# Patient Record
Sex: Female | Born: 1990 | Race: Asian | Hispanic: No | Marital: Single | Smoking: Never smoker
Health system: Southern US, Community
[De-identification: ages and names within clinical notes are randomized; demographics above are authoritative.]

## PROBLEM LIST (undated history)

## (undated) DIAGNOSIS — Z789 Other specified health status: Secondary | ICD-10-CM

---
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
## 2011-02-26 LAB — I-STAT 8, (EC8 V) (CONVERTED LAB)
Acid-base deficit: 19 — ABNORMAL HIGH
BUN: 8
Bicarbonate: 10.5 — ABNORMAL LOW
Chloride: 112
Glucose, Bld: 121 — ABNORMAL HIGH
HCT: 49
Hemoglobin: 16.7 — ABNORMAL HIGH
Operator id: 222501
Potassium: 3.2 — ABNORMAL LOW
Sodium: 145
TCO2: 12
pCO2, Ven: 37 — ABNORMAL LOW
pH, Ven: 7.062 — CL

## 2011-02-26 LAB — CBC
HCT: 43.3
Hemoglobin: 14.1
MCHC: 32.5
MCV: 73.7 — ABNORMAL LOW
Platelets: 430 — ABNORMAL HIGH
RBC: 5.88 — ABNORMAL HIGH
RDW: 13.6
WBC: 19.7 — ABNORMAL HIGH

## 2011-02-26 LAB — COMPREHENSIVE METABOLIC PANEL
ALT: 13
ALT: 14
ALT: 29
ALT: 8
AST: 15
AST: 23
AST: 25
AST: 34
Albumin: 3 — ABNORMAL LOW
Albumin: 3.4 — ABNORMAL LOW
Albumin: 3.7
Albumin: 4.4
Alkaline Phosphatase: 104
Alkaline Phosphatase: 74
Alkaline Phosphatase: 83
Alkaline Phosphatase: 86
BUN: 4 — ABNORMAL LOW
BUN: 5 — ABNORMAL LOW
BUN: 8
BUN: <1 — ABNORMAL LOW
CO2: 15 — ABNORMAL LOW
CO2: 19
CO2: 20
CO2: 24
Calcium: 8.6
Calcium: 8.7
Calcium: 8.8
Calcium: 9.4
Chloride: 109
Chloride: 110
Chloride: 110
Chloride: 111
Creatinine, Ser: 0.49
Creatinine, Ser: 0.54
Creatinine, Ser: 0.55
Creatinine, Ser: 0.77
Glucose, Bld: 103 — ABNORMAL HIGH
Glucose, Bld: 109 — ABNORMAL HIGH
Glucose, Bld: 127 — ABNORMAL HIGH
Glucose, Bld: 92
Potassium: 3 — ABNORMAL LOW
Potassium: 3.5
Potassium: 3.8
Potassium: 3.8
Sodium: 137
Sodium: 138
Sodium: 139
Sodium: 145
Total Bilirubin: 0.7
Total Bilirubin: 0.7
Total Bilirubin: 1
Total Bilirubin: 2.1 — ABNORMAL HIGH
Total Protein: 6
Total Protein: 6.6
Total Protein: 7.1
Total Protein: 8.5 — ABNORMAL HIGH

## 2011-02-26 LAB — SALICYLATE LEVEL: Salicylate Lvl: <4

## 2011-02-26 LAB — POCT I-STAT EG7
Acid-base deficit: 5 — ABNORMAL HIGH
Acid-base deficit: 5 — ABNORMAL HIGH
Bicarbonate: 19.4 — ABNORMAL LOW
Bicarbonate: 19.7 — ABNORMAL LOW
Calcium, Ion: 1.23
Calcium, Ion: 1.26
HCT: 40
HCT: 40
Hemoglobin: 13.6
Hemoglobin: 13.6
O2 Saturation: 76
O2 Saturation: 84
Operator id: 154641
Operator id: 240851
Patient temperature: 36.6
Potassium: 3.7
Potassium: 3.7
Sodium: 141
Sodium: 142
TCO2: 20
TCO2: 21
pCO2, Ven: 34 — ABNORMAL LOW
pCO2, Ven: 34.1 — ABNORMAL LOW
pH, Ven: 7.363 — ABNORMAL HIGH
pH, Ven: 7.37 — ABNORMAL HIGH
pO2, Ven: 42
pO2, Ven: 48 — ABNORMAL HIGH

## 2011-02-26 LAB — BILIRUBIN, FRACTIONATED(TOT/DIR/INDIR)
Bilirubin, Direct: <0.1
Total Bilirubin: 0.9

## 2011-02-26 LAB — URINE MICROSCOPIC-ADD ON

## 2011-02-26 LAB — URINALYSIS, ROUTINE W REFLEX MICROSCOPIC
Bilirubin Urine: NEGATIVE
Glucose, UA: NEGATIVE
Hgb urine dipstick: NEGATIVE
Ketones, ur: NEGATIVE
Leukocytes, UA: NEGATIVE
Nitrite: NEGATIVE
Protein, ur: 30 — AB
Specific Gravity, Urine: 1.022
Urobilinogen, UA: 0.2
pH: 5.5

## 2011-02-26 LAB — DIFFERENTIAL
Basophils Absolute: 0
Basophils Relative: 0
Eosinophils Absolute: 1.2
Eosinophils Relative: 6 — ABNORMAL HIGH
Lymphocytes Relative: 43
Lymphs Abs: 8.5 — ABNORMAL HIGH
Monocytes Absolute: 1.4 — ABNORMAL HIGH
Monocytes Relative: 7
Neutro Abs: 8.6 — ABNORMAL HIGH
Neutrophils Relative %: 44

## 2011-02-26 LAB — LACTIC ACID, PLASMA: Lactic Acid, Venous: 2.1

## 2011-02-26 LAB — POCT I-STAT CREATININE
Creatinine, Ser: 4.8 — ABNORMAL HIGH
Operator id: 222501

## 2011-02-26 LAB — PATHOLOGIST SMEAR REVIEW

## 2011-02-26 LAB — PREGNANCY, URINE: Preg Test, Ur: NEGATIVE

## 2011-02-26 LAB — RAPID URINE DRUG SCREEN, HOSP PERFORMED
Amphetamines: NOT DETECTED
Barbiturates: NOT DETECTED
Benzodiazepines: NOT DETECTED
Cocaine: NOT DETECTED
Opiates: NOT DETECTED
Tetrahydrocannabinol: NOT DETECTED

## 2011-02-26 LAB — ETHANOL: Alcohol, Ethyl (B): <5

## 2011-02-26 LAB — ACETAMINOPHEN LEVEL: Acetaminophen (Tylenol), Serum: <10 — ABNORMAL LOW

## 2016-09-19 ENCOUNTER — Ambulatory Visit (INDEPENDENT_AMBULATORY_CARE_PROVIDER_SITE_OTHER): Payer: BLUE CROSS/BLUE SHIELD

## 2016-09-19 ENCOUNTER — Ambulatory Visit (INDEPENDENT_AMBULATORY_CARE_PROVIDER_SITE_OTHER): Payer: BLUE CROSS/BLUE SHIELD | Admitting: Physician Assistant

## 2016-09-19 VITALS — BP 122/78 | HR 69 | Temp 98.3°F | Resp 18 | Ht 61.0 in | Wt 115.6 lb

## 2016-09-19 DIAGNOSIS — M25471 Effusion, right ankle: Secondary | ICD-10-CM

## 2016-09-19 DIAGNOSIS — M25571 Pain in right ankle and joints of right foot: Secondary | ICD-10-CM | POA: Diagnosis not present

## 2016-09-19 DIAGNOSIS — S93491A Sprain of other ligament of right ankle, initial encounter: Secondary | ICD-10-CM | POA: Diagnosis not present

## 2016-09-19 MED ORDER — MELOXICAM 7.5 MG PO TABS: 7.5000 mg | ORAL_TABLET | Freq: Every day | ORAL | 0 refills | Status: DC

## 2016-09-19 NOTE — Progress Notes (Signed)
   ARIAM MOL  MRN: [id number] DOB: 04-15-1991  PCP: Pcp Not In System  Subjective:  Pt is a 26 year old female who presents to clinic for ankle injury x three weeks. She was walking down steps and her ankle turned inward. Pain is located on the outside of the ankle. Pain does not radiate.  Walking makes it worse. No problems with weight bearing. Endorses swelling - this is her main complaint.  She is not wrapping it or staying off it.  She works as a Advertising account planner.  Denies joint redness, increased warmth, n/t, weakness, instability.   Review of Systems  Musculoskeletal: Positive for arthralgias (ankle), gait problem and joint swelling.  Skin: Negative.   Neurological: Negative for weakness and numbness.    There are no active problems to display for this patient.   No current outpatient prescriptions on file prior to visit.   No current facility-administered medications on file prior to visit.     No Known Allergies   Objective:  BP 122/78   Pulse 69   Temp 98.3 F (36.8 C) (Oral)   Resp 18   Ht  (1.549 m)   Wt 115 lb 9.6 oz (52.4 kg)   LMP 09/19/2016   SpO2 98%   BMI 21.84 kg/m   Physical Exam  Constitutional: She is oriented to person, place, and time and well-developed, well-nourished, and in no distress. No distress.  Cardiovascular: Normal rate, regular rhythm and normal heart sounds.   Musculoskeletal:       Right ankle: She exhibits swelling. She exhibits normal range of motion, no ecchymosis, no deformity, no laceration and normal pulse. Tenderness. Lateral malleolus and AITFL tenderness found. No proximal fibula tenderness found.  Neurological: She is alert and oriented to person, place, and time. GCS score is 15.  Skin: Skin is warm and dry.  Psychiatric: Mood, memory, affect and judgment normal.  Vitals reviewed.  Dg Ankle Complete Right  Result Date: 09/19/2016 CLINICAL DATA:  26 year old female status post twisting injury. Lateral ankle pain  and tenderness to palpation. EXAM: RIGHT ANKLE - COMPLETE 3+ VIEW COMPARISON:  None. FINDINGS: Bone mineralization is within normal limits. Normal mortise joint alignment. Talar dome intact. Positive ankle joint effusion evident on the lateral view. Anterior and mild lateral soft tissue swelling. Distal fibula intact. Calcaneus intact. No acute osseous abnormality identified. IMPRESSION: Right ankle joint effusion but no acute fracture or dislocation identified. Electronically Signed   By: Odessa Fleming M.D.   On: 09/19/2016 09:21    Assessment and Plan :  1. Sprain of anterior talofibular ligament of right ankle, initial encounter 2. Right ankle pain, unspecified chronicity 3. Right ankle swelling - DG Ankle Complete Right; Future - meloxicam (MOBIC) 7.5 MG tablet; Take 1 tablet (7.5 mg total) by mouth daily. Max dose /day  Dispense: 30 tablet; Refill: 0 - Supportive care encouraged. Demonstrated for pt ankle exercises. Advised her to keep ankle wrapped with ace wrap, elevate and ice for swelling. RTC in 2-3 weeks if no improvement. Consider physical therapy or ortho at that time.    Marco Collie, PA-C  Primary Care at [facility] 09/19/2016 8:56 AM

## 2016-09-19 NOTE — Patient Instructions (Addendum)
Thank you for coming in today. I hope you feel we met your needs.  Feel free to call [facility] if you have any questions or further requests.  Please consider signing up for MyChart if you do not already have it, as this is a great way to communicate with me.  Best,  Whitney McVey, PA-C  You have an ankle sprain. It is very important these first few weeks after the injury to immobilize it with brace and perform strengthening exercises.  Elevate your ankle above the heart to decrease swelling. Apply ice for no more than 20 minutes every hour to help with swelling and pain.   Ankle Sprain, Phase I Rehab Ask your health care provider which exercises are safe for you. Do exercises exactly as told by your health care provider and adjust them as directed. It is normal to feel mild stretching, pulling, tightness, or discomfort as you do these exercises, but you should stop right away if you feel sudden pain or your pain gets worse.Do not begin these exercises until told by your health care provider. Stretching and range of motion exercises These exercises warm up your muscles and joints and improve the movement and flexibility of your lower leg and ankle. These exercises also help to relieve pain and stiffness. Exercise A: Gastroc and soleus stretch   1. Sit on the floor with your left / right leg extended. 2. Loop a belt or towel around the ball of your left / right foot. The ball of your foot is on the walking surface, right under your toes. 3. Keep your left / right ankle and foot relaxed and keep your knee straight while you use the belt or towel to pull your foot toward you. You should feel a gentle stretch behind your calf or knee. 4. Hold this position for __________ seconds, then release to the starting position. Repeat the exercise with your knee bent. You can put a pillow or a rolled bath towel under your knee to support it. You should feel a stretch deep in your calf or at your Achilles  tendon. Repeat each stretch __________ times. Complete these stretches __________ times a day. Exercise B: Ankle alphabet   1. Sit with your left / right leg supported at the lower leg.  Do not rest your foot on anything.  Make sure your foot has room to move freely. 2. Think of your left / right foot as a paintbrush, and move your foot to trace each letter of the alphabet in the air. Keep your hip and knee still while you trace. Make the letters as large as you can without feeling discomfort. 3. Trace every letter from A to Z. Repeat __________ times. Complete this exercise __________ times a day. Strengthening exercises These exercises build strength and endurance in your ankle and lower leg. Endurance is the ability to use your muscles for a long time, even after they get tired. Exercise C: Dorsiflexors   1. Secure a rubber exercise band or tube to an object, such as a table leg, that will stay still when the band is pulled. Secure the other end around your left / right foot. 2. Sit on the floor facing the object, with your left / right leg extended. The band or tube should be slightly tense when your foot is relaxed. 3. Slowly bring your foot toward you, pulling the band tighter. 4. Hold this position for __________ seconds. 5. Slowly return your foot to the starting position. Repeat __________  times. Complete this exercise __________ times a day. Exercise D: Plantar flexors   1. Sit on the floor with your left / right leg extended. 2. Loop a rubber exercise tube or band around the ball of your left / right foot. The ball of your foot is on the walking surface, right under your toes.  Hold the ends of the band or tube in your hands.  The band or tube should be slightly tense when your foot is relaxed. 3. Slowly point your foot and toes downward, pushing them away from you. 4. Hold this position for __________ seconds. 5. Slowly return your foot to the starting position. Repeat  __________ times. Complete this exercise __________ times a day. Exercise E: Evertors  1. Sit on the floor with your legs straight out in front of you. 2. Loop a rubber exercise band or tube around the ball of your left / right foot. The ball of your foot is on the walking surface, right under your toes.  Hold the ends of the band in your hands, or secure the band to a stable object.  The band or tube should be slightly tense when your foot is relaxed. 3. Slowly push your foot outward, away from your other leg. 4. Hold this position for __________ seconds. 5. Slowly return your foot to the starting position. Repeat __________ times. Complete this exercise __________ times a day. This information is not intended to replace advice given to you by your health care provider. Make sure you discuss any questions you have with your health care provider. Document Released: 12/20/2004 Document Revised: 01/26/2016 Document Reviewed: 04/04/2015 Elsevier Interactive Patient Education  2017 [employer].   IF you received an x-ray today, you will receive an invoice from [facility]. Please contact [facility] at [phone] with questions or concerns regarding your invoice.   IF you received labwork today, you will receive an invoice from [facility]. Please contact [facility] at [phone] with questions or concerns regarding your invoice.   Our billing staff will not be able to assist you with questions regarding bills from these companies.  You will be contacted with the lab results as soon as they are available. The fastest way to get your results is to activate your My Chart account. Instructions are located on the last page of this paperwork. If you have not heard from [place] regarding the results in 2 weeks, please contact this office.

## 2016-09-22 ENCOUNTER — Emergency Department (HOSPITAL_COMMUNITY): Payer: BLUE CROSS/BLUE SHIELD

## 2016-09-22 ENCOUNTER — Encounter (HOSPITAL_COMMUNITY): Payer: Self-pay | Admitting: *Deleted

## 2016-09-22 ENCOUNTER — Inpatient Hospital Stay (HOSPITAL_COMMUNITY)
Admission: EM | Admit: 2016-09-22 | Discharge: 2016-09-25 | DRG: 603 | Disposition: A | Discharge status: Home / Self Care | Payer: BLUE CROSS/BLUE SHIELD | Attending: Family Medicine | Admitting: Family Medicine

## 2016-09-22 DIAGNOSIS — N179 Acute kidney failure, unspecified: Secondary | ICD-10-CM | POA: Diagnosis not present

## 2016-09-22 DIAGNOSIS — L03211 Cellulitis of face: Principal | ICD-10-CM

## 2016-09-22 DIAGNOSIS — R229 Localized swelling, mass and lump, unspecified: Secondary | ICD-10-CM | POA: Diagnosis not present

## 2016-09-22 DIAGNOSIS — R110 Nausea: Secondary | ICD-10-CM | POA: Diagnosis not present

## 2016-09-22 DIAGNOSIS — L0201 Cutaneous abscess of face: Secondary | ICD-10-CM

## 2016-09-22 DIAGNOSIS — T368X5A Adverse effect of other systemic antibiotics, initial encounter: Secondary | ICD-10-CM | POA: Diagnosis not present

## 2016-09-22 LAB — CBC WITH DIFFERENTIAL/PLATELET
BASOS PCT: 0 %
Basophils Absolute: 0 10*3/uL (ref 0.0–0.1)
Eosinophils Absolute: 0.2 10*3/uL (ref 0.0–0.7)
Eosinophils Relative: 2 %
HEMATOCRIT: 38.1 % (ref 36.0–46.0)
Hemoglobin: 12.6 g/dL (ref 12.0–15.0)
LYMPHS ABS: 2 10*3/uL (ref 0.7–4.0)
Lymphocytes Relative: 17 %
MCH: 25.7 pg — AB (ref 26.0–34.0)
MCHC: 33.1 g/dL (ref 30.0–36.0)
MCV: 77.8 fL — ABNORMAL LOW (ref 78.0–100.0)
MONO ABS: 0.7 10*3/uL (ref 0.1–1.0)
MONOS PCT: 6 %
NEUTROS ABS: 9.2 10*3/uL — AB (ref 1.7–7.7)
Neutrophils Relative %: 75 %
Platelets: 273 10*3/uL (ref 150–400)
RBC: 4.9 MIL/uL (ref 3.87–5.11)
RDW: 13.4 % (ref 11.5–15.5)
WBC: 12.1 10*3/uL — ABNORMAL HIGH (ref 4.0–10.5)

## 2016-09-22 LAB — BASIC METABOLIC PANEL
ANION GAP: 5 (ref 5–15)
BUN: 6 mg/dL (ref 6–20)
CALCIUM: 8.8 mg/dL — AB (ref 8.9–10.3)
CHLORIDE: 106 mmol/L (ref 101–111)
CO2: 26 mmol/L (ref 22–32)
CREATININE: 0.69 mg/dL (ref 0.44–1.00)
GFR calc Af Amer: >60 mL/min (ref >60)
GFR calc non Af Amer: >60 mL/min (ref >60)
GLUCOSE: 84 mg/dL (ref 65–99)
Potassium: 3.8 mmol/L (ref 3.5–5.1)
Sodium: 137 mmol/L (ref 135–145)

## 2016-09-22 LAB — I-STAT BETA HCG BLOOD, ED (MC, WL, AP ONLY)

## 2016-09-22 MED ORDER — SODIUM CHLORIDE 0.9 % IV SOLN
INTRAVENOUS | Status: DC
  Administered 2016-09-22 – 2016-09-24 (×3): via INTRAVENOUS

## 2016-09-22 MED ORDER — MORPHINE SULFATE (PF) 2 MG/ML IV SOLN
2.0000 mg | INTRAVENOUS | Status: DC | PRN
  Administered 2016-09-22 – 2016-09-23 (×2): 2 mg via INTRAVENOUS
  Filled 2016-09-22 (×2): qty 1

## 2016-09-22 MED ORDER — ENOXAPARIN SODIUM 40 MG/0.4ML ~~LOC~~ SOLN
40.0000 mg | SUBCUTANEOUS | Status: DC
Start: 2016-09-22 — End: 2016-09-25
  Administered 2016-09-22 – 2016-09-24 (×3): 40 mg via SUBCUTANEOUS
  Filled 2016-09-22 (×3): qty 0.4

## 2016-09-22 MED ORDER — IOPAMIDOL (ISOVUE-300) INJECTION 61%
INTRAVENOUS | Status: AC
  Administered 2016-09-22: 75 mL
  Filled 2016-09-22: qty 75

## 2016-09-22 MED ORDER — MORPHINE SULFATE (PF) 4 MG/ML IV SOLN
4.0000 mg | Freq: Once | INTRAVENOUS | Status: AC
  Administered 2016-09-22: 4 mg via INTRAVENOUS
  Filled 2016-09-22: qty 1

## 2016-09-22 MED ORDER — VANCOMYCIN HCL IN DEXTROSE 750-5 MG/150ML-% IV SOLN
750.0000 mg | Freq: Two times a day (BID) | INTRAVENOUS | Status: DC
  Administered 2016-09-23 – 2016-09-25 (×5): 750 mg via INTRAVENOUS
  Filled 2016-09-22 (×7): qty 150

## 2016-09-22 MED ORDER — HYDROMORPHONE HCL 1 MG/ML IJ SOLN
0.5000 mg | Freq: Once | INTRAMUSCULAR | Status: AC
Start: 2016-09-22 — End: 2016-09-22
  Administered 2016-09-22: 0.5 mg via INTRAVENOUS
  Filled 2016-09-22: qty 1

## 2016-09-22 MED ORDER — VANCOMYCIN HCL IN DEXTROSE 1-5 GM/200ML-% IV SOLN
1000.0000 mg | Freq: Once | INTRAVENOUS | Status: AC
  Administered 2016-09-22: 1000 mg via INTRAVENOUS
  Filled 2016-09-22: qty 200

## 2016-09-22 NOTE — H&P (Signed)
Family Medicine Teaching [facility] Admission History and Physical Service Pager: [phone]  Patient name: Katelyn Peck Medical record number: [id number] Date of birth: 1990/07/20 Age: 26 y.o. Gender: female  Primary Care Provider: Pcp Not In System Consultants: none Code Status: FULL  Chief Complaint: left facial swelling   Assessment and Plan: Katelyn Peck is a 26 y.o. female presenting with left facial pain, swelling, and redness. No significant PMH.  Left Facial Cellulitis: Vitals stable and afebrile. Mild leukocytosis to 12.1. CT maxillofacial noting cellulitis with a possible small developing abscess in/just above the upper lip but no clearly drainable fluid collection identified. No signs/symptoms of orbital or periorbital cellulitis.  - admit to medsurg, attending Dr. McDiarmid - s/p Vancomycin in ED - Vancomycin per pharmacy - IV Morphine  q 4 PRN for pain - if not improving, consider ENT consult  FEN/GI: full liquid diet, IVF Prophylaxis: Lovenox   Disposition: admit for IV antibiotics   History of Present Illness:  Katelyn Peck is a 26 y.o. female presenting with left facial pain and swelling. Patient reports of having a pimple above the left upper lip starting Monday. The next day it became painful and on the third day the area began to swell and become more painful. Yesterday she tried to pop it in hopes it would relieve some of the pain and little pus may have come out. Her swelling and pain continued to worsen. She went to her PCP today and was given Bactrim; she took 1 tablet and decided to come to the ED due to continued severe pain.   In the ED, vitals stable. Mild leukocytosis. Was started on Vancomycin.   Review Of Systems: Per HPI  Review of Systems  Constitutional: Positive for fever. Negative for chills.  HENT: Negative for congestion, sinus pain and sore throat.   Eyes: Negative for blurred vision, double vision, photophobia, pain, discharge and  redness.  Respiratory: Negative for cough, shortness of breath and stridor.   Cardiovascular: Negative for chest pain and palpitations.  Gastrointestinal: Negative for abdominal pain, constipation, diarrhea, nausea and vomiting.  Genitourinary: Negative for dysuria and frequency.  Neurological: Negative for dizziness, focal weakness and headaches.  Psychiatric/Behavioral: Negative for substance abuse.    Patient Active Problem List   Diagnosis Date Noted  . Cellulitis of face 09/22/2016    Past Medical History: History reviewed. No pertinent past medical history.  Past Surgical History: History reviewed. No pertinent surgical history.  Social History: Social History  Substance Use Topics  . Smoking status: Never Smoker  . Smokeless tobacco: Never Used  . Alcohol use No   No other drug use Please also refer to relevant sections of EMR.  Family History: History reviewed. No pertinent family history.  No significant medical issues   Allergies and Medications: No Known Allergies No current facility-administered medications on file prior to encounter.    Current Outpatient Prescriptions on File Prior to Encounter  Medication Sig Dispense Refill  . meloxicam (MOBIC) 7.5 MG tablet Take 1 tablet (7.5 mg total) by mouth daily. Max dose /day (Patient taking differently: Take 7.5 mg by mouth daily. Max 15 mg/day) 30 tablet 0    Objective: BP 129/80   Pulse 88   Temp 98.2 F (36.8 C) (Oral)   Resp 18   LMP 09/19/2016   SpO2 100%  Exam: General: NAD Eyes: EOMI, PERRL ENTM: unable to fully open mouth to evaluate posterior pharynx. No significant findings with palpation of the left inner  side of the cheek.  Neck: normal ROM, supple no lymphadenopathy Cardiovascular: RRR. No m/r/g Respiratory: CTAB, normal effort, no stridor Gastrointestinal: Soft, nontender, nondistended, NABS, no organomegaly MSK: able to move all extremities equally Derm: note below image,  otherwise unremarkable Neuro: Awake, alert, no focal deficits grossly, normal speech Psych: Mood and affect euthymic, normal rate and volume of speech        Labs and Imaging: CBC BMET   Recent Labs Lab 09/22/16 1513  WBC 12.1*  HGB 12.6  HCT 38.1  PLT 273    Recent Labs Lab 09/22/16 1513  NA 137  K 3.8  CL 106  CO2 26  BUN 6  CREATININE 0.69  GLUCOSE 84  CALCIUM 8.8*    Beta hcg: < 0.5  CT Maxillofacial:  IMPRESSION: Diffuse swelling of the upper lip extending into the left face compatible with cellulitis. Possible small developing abscess in/just above the left upper lip, however no sizable/clearly drainable fluid collection is yet identified.  Palma Holter, MD 09/22/2016, 7:33 PM PGY-2,  Family Medicine FPTS Intern pager: [phone], text pages welcome

## 2016-09-22 NOTE — ED Notes (Signed)
edp notified that the patients swelling is worse on the left side of her face along with pain not under control, edp at bedside assessing patient

## 2016-09-22 NOTE — ED Notes (Signed)
Pt given drink per edp

## 2016-09-22 NOTE — ED Provider Notes (Signed)
[facility]-EMERGENCY DEPT Provider Note   CSN: [id number] Arrival date & time: 09/22/16  1328     History   Chief Complaint Chief Complaint  Patient presents with  . Abscess  . Facial Swelling    HPI Katelyn Peck is a 26 y.o. female.  Pt without significant PMHx, presents w abscess on her L upper lip and facial swelling. On Monday, pt noticed a pimple and popped it. She stated having worsening pain on Thursday and noticed focal swelling on her lip. She popped the pimple once more and the next day had worsening pain w swelling that travelled to the left side of her face. Denies difficulty breathing or swallowing, states pain is achy, 8/10. Reported to urgent care earlier today and was given Bactrim. She has taken 1 dose. Reports to ER because she is not feeling better and is concerned about swelling/pain.      History reviewed. No pertinent past medical history.  There are no active problems to display for this patient.   History reviewed. No pertinent surgical history.  OB History    No data available       Home Medications    Prior to Admission medications   Medication Sig Start Date End Date Taking? Authorizing Provider  meloxicam (MOBIC) 7.5 MG tablet Take 1 tablet (7.5 mg total) by mouth daily. Max dose /day 09/19/16   Madelaine Bhat McVey, PA-C    Family History History reviewed. No pertinent family history.  Social History Social History  Substance Use Topics  . Smoking status: Never Smoker  . Smokeless tobacco: Never Used  . Alcohol use No     Allergies   Patient has no known allergies.   Review of Systems Review of Systems  Constitutional: Positive for chills. Negative for fever.  HENT: Positive for facial swelling. Negative for trouble swallowing.   Skin: Positive for wound (L upper lip).  Allergic/Immunologic: Negative for immunocompromised state.     Physical Exam Updated Vital Signs BP 129/88 (BP Location: Left Arm)   Pulse 73    Temp 98.2 F (36.8 C) (Oral)   Resp 18   LMP 09/19/2016   SpO2 100%   Physical Exam  Constitutional: She appears well-developed and well-nourished.  HENT:  Head: Normocephalic and atraumatic.  Erythematous, draining purulent abscess on face above L upper lip. Upper lip edematous, L face edematous from zygomatic to mandible without extension under mandible or into neck.  Tolerating secretions.  Eyes: Conjunctivae and EOM are normal.  Neck: Normal range of motion. Neck supple.  Cardiovascular: Normal rate, regular rhythm, normal heart sounds and intact distal pulses.  Exam reveals no friction rub.   No murmur heard. Pulmonary/Chest: Effort normal and breath sounds normal. No respiratory distress. She has no wheezes. She has no rales.  Lymphadenopathy:    She has no cervical adenopathy.  Psychiatric: She has a normal mood and affect. Her behavior is normal.  Nursing note and vitals reviewed.    ED Treatments / Results  Labs (all labs ordered are listed, but only abnormal results are displayed) Labs Reviewed  BASIC METABOLIC PANEL - Abnormal; Notable for the following:       Result Value   Calcium 8.8 (*)    All other components within normal limits  CBC WITH DIFFERENTIAL/PLATELET - Abnormal; Notable for the following:    WBC 12.1 (*)    MCV 77.8 (*)    MCH 25.7 (*)    Neutro Abs 9.2 (*)    All  other components within normal limits  I-STAT BETA HCG BLOOD, ED ([facility], [facility], [facility] ONLY)    EKG  EKG Interpretation None       Radiology No results found.  Procedures Procedures (including critical care time)  Medications Ordered in ED Medications  vancomycin (VANCOCIN) IVPB 1000 mg/200 mL premix (1,000 mg Intravenous New Bag/Given 09/22/16 1522)  vancomycin (VANCOCIN) IVPB 750 mg/150 ml premix (not administered)  morphine 4 MG/ML injection 4 mg (4 mg Intravenous Given 09/22/16 1521)  iopamidol (ISOVUE-300) 61 % injection (75 mLs  Contrast Given 09/22/16 1600)     Initial  Impression / Assessment and Plan / ED Course  I have reviewed the triage vital signs and the nursing notes.  Pertinent labs & imaging results that were available during my care of the patient were reviewed by me and considered in my medical decision making (see chart for details).     Pt w abscess to area above L upper lip w associated lip and L sided facial edema. Pt tolerating secretions, not in respiratory distress. Pt afebrile. WBC slightly elevated 12.1. CT maxillofacial pending. IV Vancomycin started in ED. Morphine for pain. Plan to consult hospitalist for possible admission.   Care assumed by Sharilyn Sites, PA-C.  Patient discussed with and seen by Dr. Adriana Simas. Final Clinical Impressions(s) / ED Diagnoses   Final diagnoses:  Facial abscess    New Prescriptions New Prescriptions   No medications on file     [place] N Russo, PA-C 09/22/16 1616    Donnetta Hutching, MD 09/23/16 1024

## 2016-09-22 NOTE — ED Provider Notes (Signed)
Assumed care from PA Russo at shift change.  See her note for full H&P.  Briefly, 26 y.o. F here with apparent facial cellulitis which started out as a small pimple and seems to have worsened from there.  Labs obtained--- leukocytosis noted.  Plan:  CT max/face pending.  Likely will need admission for continued IV abx.  Results for orders placed or performed during the hospital encounter of 09/22/16  Basic metabolic panel  Result Value Ref Range   Sodium 137 135 - 145 mmol/L   Potassium 3.8 3.5 - 5.1 mmol/L   Chloride 106 101 - 111 mmol/L   CO2 26 22 - 32 mmol/L   Glucose, Bld 84 65 - 99 mg/dL   BUN 6 6 - 20 mg/dL   Creatinine, Ser 1.61 0.44 - 1.00 mg/dL   Calcium 8.8 (L) 8.9 - 10.3 mg/dL   GFR calc non Af Amer >60 >60 mL/min   GFR calc Af Amer >60 >60 mL/min   Anion gap 5 5 - 15  CBC with Differential  Result Value Ref Range   WBC 12.1 (H) 4.0 - 10.5 K/uL   RBC 4.90 3.87 - 5.11 MIL/uL   Hemoglobin 12.6 12.0 - 15.0 g/dL   HCT 09.6 04.5 - 40.9 %   MCV 77.8 (L) 78.0 - 100.0 fL   MCH 25.7 (L) 26.0 - 34.0 pg   MCHC 33.1 30.0 - 36.0 g/dL   RDW 81.1 91.4 - 78.2 %   Platelets 273 150 - 400 K/uL   Neutrophils Relative % 75 %   Neutro Abs 9.2 (H) 1.7 - 7.7 K/uL   Lymphocytes Relative 17 %   Lymphs Abs 2.0 0.7 - 4.0 K/uL   Monocytes Relative 6 %   Monocytes Absolute 0.7 0.1 - 1.0 K/uL   Eosinophils Relative 2 %   Eosinophils Absolute 0.2 0.0 - 0.7 K/uL   Basophils Relative 0 %   Basophils Absolute 0.0 0.0 - 0.1 K/uL  I-Stat beta hCG blood, ED  Result Value Ref Range   I-stat hCG, quantitative <5.0 <5 mIU/mL   Comment 3            Ct Maxillofacial W Contrast  Result Date: 09/22/2016 CLINICAL DATA:  Facial swelling.  Left upper lip abscess. EXAM: CT MAXILLOFACIAL WITH CONTRAST TECHNIQUE: Multidetector CT imaging of the maxillofacial structures was performed with intravenous contrast. Multiplanar CT image reconstructions were also generated. A small metallic BB was placed on the  right temple in order to reliably differentiate right from left. CONTRAST:  75mL ISOVUE-300 IOPAMIDOL (ISOVUE-300) INJECTION 61% COMPARISON:  None. FINDINGS: Osseous: No evidence of fracture or destructive osseous process. No significant dental disease identified. Temporomandibular joints are located. Orbits: Intact globes.  No inflammatory changes. Sinuses: Paranasal sinuses and mastoid air cells are clear. There is slight rightward nasal septal deviation. Soft tissues: There is moderate diffuse soft tissue swelling involving the upper lip. To the left of midline at this level is a 2.1 x 0.4 cm region of slightly lower density just below the skin surface which may reflect a small developing abscess, however a well organized, sizeable, clearly drainable fluid collection is not identified. Soft tissue swelling extends into the subcutaneous tissues of the left face. Limited intracranial: Unremarkable. IMPRESSION: Diffuse swelling of the upper lip extending into the left face compatible with cellulitis. Possible small developing abscess in/just above the left upper lip, however no sizable/clearly drainable fluid collection is yet identified. Electronically Signed   By: Jolaine Click.D.  On: 09/22/2016 16:39    5:13 PM Patient reassessed-- states pain did not improve after morphine.  Will give dose of dilaudid here.  CT scan consistent with facial cellulitis--- questionable developing abscess, however no drainable fluid collection at this time.  Will admit for continued IV abx.     Garlon Hatchet, PA-C 09/22/16 2321    Shaune Pollack, MD 09/23/16 [phone]

## 2016-09-22 NOTE — Progress Notes (Signed)
Pharmacy Antibiotic Note  Katelyn Peck is a 26 y.o. female admitted on 09/22/2016 with cellulitis.  Pharmacy has been consulted for vancomycin dosing. Pt is afebrile but WBC is elevated at 12.1. SCr is WNL.   Plan: Vancomycin 1gm IV x 1 then  IV Q12H F/u renal fxn, C&S, clinical status and trough at SS     Temp (24hrs), Avg:98.2 F (36.8 C), Min:98.2 F (36.8 C), Max:98.2 F (36.8 C)   Recent Labs Lab 09/22/16 1513  WBC 12.1*  CREATININE 0.69    Estimated Creatinine Clearance: 81.1 mL/min (by C-G formula based on SCr of 0.69 mg/dL).    No Known Allergies  Antimicrobials this admission: Vanc 4/21>>  Dose adjustments this admission: N/A  Microbiology results: Pending  Thank you for allowing pharmacy to be a part of this patient's care.  Hakeen Shipes, Drake Leach 09/22/2016 4:14 PM

## 2016-09-22 NOTE — ED Triage Notes (Signed)
Pt has bump/abscess to left upper lip x 1 week that is getting worse. Was seen at [facility] today and started on antibiotics but feels like swelling is increasing to her face and lip, pt reports possible fever.

## 2016-09-22 NOTE — ED Notes (Signed)
Patient transported to CT 

## 2016-09-23 DIAGNOSIS — R229 Localized swelling, mass and lump, unspecified: Secondary | ICD-10-CM | POA: Diagnosis present

## 2016-09-23 DIAGNOSIS — N179 Acute kidney failure, unspecified: Secondary | ICD-10-CM | POA: Diagnosis not present

## 2016-09-23 DIAGNOSIS — T368X5A Adverse effect of other systemic antibiotics, initial encounter: Secondary | ICD-10-CM | POA: Diagnosis not present

## 2016-09-23 DIAGNOSIS — L03211 Cellulitis of face: Secondary | ICD-10-CM | POA: Diagnosis present

## 2016-09-23 DIAGNOSIS — R110 Nausea: Secondary | ICD-10-CM | POA: Diagnosis not present

## 2016-09-23 LAB — CBC
HCT: 39.2 % (ref 36.0–46.0)
HEMOGLOBIN: 12.8 g/dL (ref 12.0–15.0)
MCH: 25.4 pg — ABNORMAL LOW (ref 26.0–34.0)
MCHC: 32.7 g/dL (ref 30.0–36.0)
MCV: 77.8 fL — ABNORMAL LOW (ref 78.0–100.0)
Platelets: 271 10*3/uL (ref 150–400)
RBC: 5.04 MIL/uL (ref 3.87–5.11)
RDW: 13.2 % (ref 11.5–15.5)
WBC: 13.1 10*3/uL — ABNORMAL HIGH (ref 4.0–10.5)

## 2016-09-23 LAB — BASIC METABOLIC PANEL
ANION GAP: 9 (ref 5–15)
BUN: <5 mg/dL — ABNORMAL LOW (ref 6–20)
CALCIUM: 8.7 mg/dL — AB (ref 8.9–10.3)
CHLORIDE: 103 mmol/L (ref 101–111)
CO2: 25 mmol/L (ref 22–32)
CREATININE: 0.82 mg/dL (ref 0.44–1.00)
GFR calc non Af Amer: >60 mL/min (ref >60)
Glucose, Bld: 81 mg/dL (ref 65–99)
Potassium: 3.8 mmol/L (ref 3.5–5.1)
Sodium: 137 mmol/L (ref 135–145)

## 2016-09-23 LAB — HIV ANTIBODY (ROUTINE TESTING W REFLEX): HIV SCREEN 4TH GENERATION: NONREACTIVE

## 2016-09-23 MED ORDER — HYDROMORPHONE HCL 1 MG/ML IJ SOLN
0.5000 mg | Freq: Four times a day (QID) | INTRAMUSCULAR | Status: DC | PRN
  Administered 2016-09-23 (×2): 0.5 mg via INTRAVENOUS
  Filled 2016-09-23 (×2): qty 1

## 2016-09-23 MED ORDER — PIPERACILLIN-TAZOBACTAM 3.375 G IVPB 30 MIN
3.3750 g | INTRAVENOUS | Status: AC
  Administered 2016-09-23: 3.375 g via INTRAVENOUS
  Filled 2016-09-23: qty 50

## 2016-09-23 MED ORDER — HYDROMORPHONE HCL 1 MG/ML IJ SOLN
0.5000 mg | INTRAMUSCULAR | Status: DC | PRN
  Administered 2016-09-23 – 2016-09-24 (×4): 0.5 mg via INTRAVENOUS
  Filled 2016-09-23 (×4): qty 1

## 2016-09-23 MED ORDER — PIPERACILLIN-TAZOBACTAM 3.375 G IVPB
3.3750 g | Freq: Three times a day (TID) | INTRAVENOUS | Status: DC
  Administered 2016-09-23 – 2016-09-24 (×3): 3.375 g via INTRAVENOUS
  Filled 2016-09-23 (×4): qty 50

## 2016-09-23 NOTE — Progress Notes (Addendum)
FPTS Interim Progress Note  S: Went to evaluate patient. Her pain is better controlled with dilaudid. Reports that the swelling has worsened especially around the left jaw area which is apparent to me as well. The swelling around the angle of the mandible is more pronounced compared to exam on admission.  She is not been sleeping on her left side. Denies difficulty swallowing or difficulty breathing.   Due to worsening swelling despite starting Vancomycin, I think it is worth broadening coverage. Will add Zosyn for now. Consider ENT consult this AM. Will discuss with AM team.   Palma Holter, MD 09/23/2016, 5:43 AM PGY-2, [facility] Family Medicine Service pager [phone]

## 2016-09-23 NOTE — Progress Notes (Signed)
Pharmacy Antibiotic Note  Katelyn Peck is a 26 y.o. female admitted on 09/22/2016 with cellulitis.  Pharmacy originally consulted for vancomycin dosing (Day #2). Now adding Zosyn as pt continues with facial swelling.    Plan: Vancomycin  IV Q12H Zosyn 3.375gm IV now over 30 min then 3.375gm IV q8h - subsequent doses over 4 hours F/u renal fxn, C&S, clinical status and trough at SS  Height:  (154.9 cm) Weight: 118 lb 4.8 oz (53.7 kg) IBW/kg (Calculated) : 47.8  Temp (24hrs), Avg:99.3 F (37.4 C), Min:98.2 F (36.8 C), Max:100.2 F (37.9 C)   Recent Labs Lab 09/22/16 1513  WBC 12.1*  CREATININE 0.69    Estimated Creatinine Clearance: 81.1 mL/min (by C-G formula based on SCr of 0.69 mg/dL).    No Known Allergies  Antimicrobials this admission: Vanc 4/21>> Zosyn 4/22 >>  Dose adjustments this admission: N/A  Microbiology results: Pending  Thank you for allowing pharmacy to be a part of this patient's care.  Christoper Fabian, PharmD, BCPS Clinical pharmacist, pager [phone] 09/23/2016 5:53 AM

## 2016-09-23 NOTE — Progress Notes (Signed)
Patient continues to complain of increasing left side facial pain that is not relieved by the prn morphine IV.  The swelling to the side of the face is worsening.  MD made aware.  Morphine IV dc'd and pain medication changed to IV Dilaudid.  0.5 mg IV Dilaudid given at 0237.  Will continue to monitor patient.  [employer] RN-BC, WTA.

## 2016-09-23 NOTE — Progress Notes (Signed)
New Admission Note:   Arrival Method:   Via stretcher from the ED Mental Orientation:  A & O x 4 Telemetry: N/A Assessment: Completed Skin:  Cellulitis to left lip and left side of face IV:  RAFA PIV Pain: c/o left sided facial pain Tubes:  None Safety Measures: Safety Fall Prevention Plan has been given, discussed and signed Admission: Completed [address] [place] Orientation: Patient has been orientated to the room, unit and staff.  Family:  Boyfriend is at bedside  Patient has pierced ears and pierced belly button.  Cell phone is at bedside.  Advised that [facility] not responsible for valuables.  Discussed with patient and boyfriend that we would consider her a moderate fall risk due to the IVF and the IV pain medications.  Advised that if family is not in the room that she should call for assistance to the bathroom before getting out of bed.  Both the patient and her boyfriend understand and are in agreement with the above.  Orders have been reviewed and implemented. Will continue to monitor the patient. Call light has been placed within reach and bed alarm has been activated.   Bernie Covey RN- [place], [place] Phone number: [phone]

## 2016-09-23 NOTE — Progress Notes (Signed)
Family Medicine Teaching Service Daily Progress Note Intern Pager: [phone]  Patient name: Katelyn Peck Medical record number: [id number] Date of birth: 02/04/1991 Age: 26 y.o. Gender: female  Primary Care Provider: Pcp Not In System Consultants: None Code Status: Full  Assessment and Plan: 26 y.o. female presenting with left facial pain, swelling, and redness. No significant PMH.  Left Facial Cellulitis: Mild leukocytosis to 13.1. CT maxillofacial noting cellulitis with a possible small developing abscess in/just above the upper lip but no clearly drainable fluid collection identified.  - Full liquid diet secondary to difficulty opening mouth and chewing - Vancomycin (4/21>>) and Zosyn (4/22>>) - IV Dilaudid 0.5mg  q4hr PRN for pain - Ice packs PRN - If not improving over next 24hr, consider ENT consult. - NS@75cc /hr  FEN/GI: full liquid diet, IVF Prophylaxis: Lovenox   Disposition: admit for IV antibiotics   Subjective:  Reports swelling and pain are similar to last night. Requests ice packs for face. No other acute concerns.  Objective: Temp:  [98.2 F (36.8 C)-100.2 F (37.9 C)] 99.6 F (37.6 C) (04/22 0249) Pulse Rate:  [61-90] 77 (04/22 0249) Resp:  [18] 18 (04/21 2035) BP: (112-133)/(74-96) 121/76 (04/22 0249) SpO2:  [97 %-100 %] 98 % (04/22 0249) Weight:  [118 lb 4.8 oz (53.7 kg)] 118 lb 4.8 oz (53.7 kg) (04/21 2035) Physical Exam: General: 26yo female resting comfortably HEENT: Swelling to left jaw noted with increased tenderness, no areas of fluctuance noted. Please see pictures from H&P. Cardiovascular: S1 and S2 noted, no murmurs, regular rate  Respiratory: Clear to auscultation bilaterally, no wheezes, no increased work of breathing Abdomen: Soft and nondistended, nontender Extremities: No edema noted  Laboratory:  Recent Labs Lab 09/22/16 1513 09/23/16 0531  WBC 12.1* 13.1*  HGB 12.6 12.8  HCT 38.1 39.2  PLT 273 271    Recent Labs Lab  09/22/16 1513 09/23/16 0531  NA 137 137  K 3.8 3.8  CL 106 103  CO2 26 25  BUN 6 <5*  CREATININE 0.69 0.82  CALCIUM 8.8* 8.7*  GLUCOSE 84 81   Imaging/Diagnostic Tests: Dg Ankle Complete Right  Result Date: 09/19/2016 CLINICAL DATA:  26 year old female status post twisting injury. Lateral ankle pain and tenderness to palpation. EXAM: RIGHT ANKLE - COMPLETE 3+ VIEW COMPARISON:  None. FINDINGS: Bone mineralization is within normal limits. Normal mortise joint alignment. Talar dome intact. Positive ankle joint effusion evident on the lateral view. Anterior and mild lateral soft tissue swelling. Distal fibula intact. Calcaneus intact. No acute osseous abnormality identified. IMPRESSION: Right ankle joint effusion but no acute fracture or dislocation identified. Electronically Signed   By: Odessa Fleming M.D.   On: 09/19/2016 09:21   Ct Maxillofacial W Contrast  Result Date: 09/22/2016 CLINICAL DATA:  Facial swelling.  Left upper lip abscess. EXAM: CT MAXILLOFACIAL WITH CONTRAST TECHNIQUE: Multidetector CT imaging of the maxillofacial structures was performed with intravenous contrast. Multiplanar CT image reconstructions were also generated. A small metallic BB was placed on the right temple in order to reliably differentiate right from left. CONTRAST:  75mL ISOVUE-300 IOPAMIDOL (ISOVUE-300) INJECTION 61% COMPARISON:  None. FINDINGS: Osseous: No evidence of fracture or destructive osseous process. No significant dental disease identified. Temporomandibular joints are located. Orbits: Intact globes.  No inflammatory changes. Sinuses: Paranasal sinuses and mastoid air cells are clear. There is slight rightward nasal septal deviation. Soft tissues: There is moderate diffuse soft tissue swelling involving the upper lip. To the left of midline at this level is a 2.1 x  0.4 cm region of slightly lower density just below the skin surface which may reflect a small developing abscess, however a well organized,  sizeable, clearly drainable fluid collection is not identified. Soft tissue swelling extends into the subcutaneous tissues of the left face. Limited intracranial: Unremarkable. IMPRESSION: Diffuse swelling of the upper lip extending into the left face compatible with cellulitis. Possible small developing abscess in/just above the left upper lip, however no sizable/clearly drainable fluid collection is yet identified. Electronically Signed   By: Sebastian Ache M.D.   On: 09/22/2016 16:39   Araceli Bouche, DO 09/23/2016, 9:13 AM PGY-3, [place] [facility] FPTS Intern pager: [phone], text pages welcome

## 2016-09-24 LAB — CBC
HCT: 36.5 % (ref 36.0–46.0)
HEMOGLOBIN: 12 g/dL (ref 12.0–15.0)
MCH: 25.6 pg — ABNORMAL LOW (ref 26.0–34.0)
MCHC: 32.9 g/dL (ref 30.0–36.0)
MCV: 77.8 fL — ABNORMAL LOW (ref 78.0–100.0)
Platelets: 269 10*3/uL (ref 150–400)
RBC: 4.69 MIL/uL (ref 3.87–5.11)
RDW: 13 % (ref 11.5–15.5)
WBC: 10 10*3/uL (ref 4.0–10.5)

## 2016-09-24 MED ORDER — ACETAMINOPHEN 325 MG PO TABS
650.0000 mg | ORAL_TABLET | Freq: Four times a day (QID) | ORAL | Status: DC | PRN
  Administered 2016-09-24 (×2): 650 mg via ORAL
  Filled 2016-09-24 (×2): qty 2

## 2016-09-24 MED ORDER — ONDANSETRON HCL 4 MG/2ML IJ SOLN
4.0000 mg | Freq: Four times a day (QID) | INTRAMUSCULAR | Status: DC | PRN
  Administered 2016-09-24 (×2): 4 mg via INTRAVENOUS
  Filled 2016-09-24 (×2): qty 2

## 2016-09-24 NOTE — Progress Notes (Signed)
Family Medicine Teaching Service Daily Progress Note Intern Pager: [phone]  Patient name: Katelyn Peck Medical record number: [id number] Date of birth: 05-Dec-1990 Age: 26 y.o. Gender: female  Primary Care Provider: Pcp Not In System Consultants: None Code Status: Full  Assessment and Plan: 26 y.o. female presenting with left facial pain, swelling, and redness. No significant PMH.  Left Facial Cellulitis, improving: Improving leukocytosis 12.1>13.1>10. CT maxillofacial noting cellulitis with a possible small developing abscess in/just above the upper lip but no clearly drainable fluid collection identified.  - Full liquid diet secondary to difficulty opening mouth and chewing - Vancomycin (4/21>>), likely transition to PO abx 4/24 - discontinue Zosyn - Tylenol PRN pain - ENT if not improving - warm compresses   Nausea: -  Zofran 4 mg q6h PRN  FEN/GI: full liquid diet > advance diet as, IVF Prophylaxis: Lovenox   Disposition: admit for IV antibiotics   Subjective:  Reports swelling and pain are improved. Was able to eat breakfast this AM. No other acute concerns.  Objective: Temp:  [97.5 F (36.4 C)-98.4 F (36.9 C)] 97.8 F (36.6 C) (04/23 0825) Pulse Rate:  [75-79] 79 (04/23 0825) Resp:  [18] 18 (04/23 0825) BP: (114-125)/(63-89) 114/63 (04/23 0825) SpO2:  [100 %] 100 % (04/23 0825) Weight:  [54.5 kg (120 lb 3.2 oz)] 54.5 kg (120 lb 3.2 oz) (04/22 2158) Physical Exam: General: 26yo female resting comfortably HEENT: Swelling to left jaw noted with increased tenderness, no areas of fluctuance noted. Please see pictures from H&P. Cardiovascular: S1 and S2 noted, no murmurs, regular rate  Respiratory: Clear to auscultation bilaterally, no wheezes, no increased work of breathing Abdomen: Soft and nondistended, nontender Extremities: No edema noted  Laboratory:  Recent Labs Lab 09/22/16 1513 09/23/16 0531 09/24/16 0737  WBC 12.1* 13.1* 10.0  HGB 12.6 12.8 12.0   HCT 38.1 39.2 36.5  PLT 273 271 269    Recent Labs Lab 09/22/16 1513 09/23/16 0531  NA 137 137  K 3.8 3.8  CL 106 103  CO2 26 25  BUN 6 <5*  CREATININE 0.69 0.82  CALCIUM 8.8* 8.7*  GLUCOSE 84 81   Imaging/Diagnostic Tests: No results found.   Su Grand, Medical Student 09/24/2016, 8:55 AM  Assessment and Plan: Katelyn Peck with left facial pain, swelling, and redness. No significant PMH.  Left Facial Cellulitis, improving: Leukocytosis resolved this AM. Vitals stable.  - advance diet as tolerated - Vancomycin (4/21>>) with plan to transition to PO 4/24 - discontinue Zosyn - Tylenol PRN pain - warm compress  - consider ENT consult if worsens  FEN/GI: full liquid diet > ADAT,  Prophylaxis: Lovenox   Dispo: likely DC 4/24  Physical Exam Gen: NAD Pulm: CTAB CV: RRR, no m/r/g Abd: soft, NT, ND, + bowel sounds Skin:      MSK: moves all extremities Neuro: awake, alert   Riesa Pope, MD Family Medicine, PGY 2 09/24/2016 8:51 AM

## 2016-09-24 NOTE — Progress Notes (Signed)
Patient is complaining of nausea. MD notified. Verbal order to give Zofran  IV q6h PRN. Orders placed. Will continue to monitor.

## 2016-09-25 LAB — CBC
HEMATOCRIT: 37.8 % (ref 36.0–46.0)
HEMOGLOBIN: 12.6 g/dL (ref 12.0–15.0)
MCH: 25.6 pg — ABNORMAL LOW (ref 26.0–34.0)
MCHC: 33.3 g/dL (ref 30.0–36.0)
MCV: 76.8 fL — ABNORMAL LOW (ref 78.0–100.0)
Platelets: 279 10*3/uL (ref 150–400)
RBC: 4.92 MIL/uL (ref 3.87–5.11)
RDW: 12.9 % (ref 11.5–15.5)
WBC: 9.9 10*3/uL (ref 4.0–10.5)

## 2016-09-25 LAB — BASIC METABOLIC PANEL
ANION GAP: 8 (ref 5–15)
BUN: 6 mg/dL (ref 6–20)
CALCIUM: 9.3 mg/dL (ref 8.9–10.3)
CHLORIDE: 104 mmol/L (ref 101–111)
CO2: 27 mmol/L (ref 22–32)
Creatinine, Ser: 1.38 mg/dL — ABNORMAL HIGH (ref 0.44–1.00)
GFR calc non Af Amer: 53 mL/min — ABNORMAL LOW (ref >60)
Glucose, Bld: 91 mg/dL (ref 65–99)
Potassium: 3.9 mmol/L (ref 3.5–5.1)
SODIUM: 139 mmol/L (ref 135–145)

## 2016-09-25 MED ORDER — DOXYCYCLINE HYCLATE 100 MG PO TABS
100.0000 mg | ORAL_TABLET | Freq: Two times a day (BID) | ORAL | Status: DC
  Administered 2016-09-25: 100 mg via ORAL
  Filled 2016-09-25: qty 1

## 2016-09-25 MED ORDER — DOXYCYCLINE HYCLATE 100 MG PO TABS: 100.0000 mg | ORAL_TABLET | Freq: Two times a day (BID) | ORAL | 0 refills | Status: AC

## 2016-09-25 MED ORDER — ONDANSETRON HCL 4 MG PO TABS: 4.0000 mg | ORAL_TABLET | Freq: Three times a day (TID) | ORAL | 0 refills | Status: DC | PRN

## 2016-09-25 MED ORDER — ACETAMINOPHEN 325 MG PO TABS: 650.0000 mg | ORAL_TABLET | Freq: Four times a day (QID) | ORAL | 0 refills | Status: DC | PRN

## 2016-09-25 NOTE — Discharge Instructions (Signed)
You were admitted for skin infection of the face (cellulitis). You were initially started on IV antibiotics then transitioned to oral antibiotic. Please take as directed. Your kidney marker bumped up slightly which was likely due to the IV antibiotics you were on. You have a hospital follow up visit at the family medicine clinic; they will check your kidney function to make sure the kidney marker is improving. Therefore, it is important to keep your appointment. You can also establish care at the family medicine clinic if you would like.   You can take Tylenol as needed for pain. Please do not take Ibuprofen, Meloxicam, or similar medications for pain until you kidney marker returns to normal. You can take Zofran as needed for nausea.   If you do not want to establish care at the [facility]: For the names and phone numbers of local doctors accepting patients you may call [facility] [phone] however since you have insurance, you may call the number on your insurance card for the names of doctors who accept your insurance.     Cellulitis, Adult Cellulitis is a skin infection. The infected area is usually red and sore. This condition occurs most often in the arms and lower legs. It is very important to get treated for this condition. Follow these instructions at home:  Take over-the-counter and prescription medicines only as told by your doctor.  If you were prescribed an antibiotic medicine, take it as told by your doctor. Do not stop taking the antibiotic even if you start to feel better.  Drink enough fluid to keep your pee (urine) clear or pale yellow.  Do not touch or rub the infected area.  Raise (elevate) the infected area above the level of your heart while you are sitting or lying down.  Place warm or cold wet cloths (warm or cold compresses) on the infected area. Do this as told by your doctor.  Keep all follow-up visits as told by your doctor. This is important.  These visits let your doctor make sure your infection is not getting worse. Contact a doctor if:  You have a fever.  Your symptoms do not get better after 1-2 days of treatment.  Your bone or joint under the infected area starts to hurt after the skin has healed.  Your infection comes back. This can happen in the same area or another area.  You have a swollen bump in the infected area.  You have new symptoms.  You feel ill and also have muscle aches and pains. Get help right away if:  Your symptoms get worse.  You feel very sleepy.  You throw up (vomit) or have watery poop (diarrhea) for a long time.  There are red streaks coming from the infected area.  Your red area gets larger.  Your red area turns darker. This information is not intended to replace advice given to you by your health care provider. Make sure you discuss any questions you have with your health care provider. Document Released: 11/07/2007 Document Revised: 10/27/2015 Document Reviewed: 03/30/2015 Elsevier Interactive Patient Education  2017 [place].  Doxycycline tablets or capsules What is this medicine? DOXYCYCLINE (dox i SYE kleen) is a tetracycline antibiotic. It kills certain bacteria or stops their growth. It is used to treat many kinds of infections, like dental, skin, respiratory, and urinary tract infections. It also treats acne, Lyme disease, malaria, and certain sexually transmitted infections. This medicine may be used for other purposes; ask your health care provider  or pharmacist if you have questions. COMMON BRAND NAME(S): Acticlate, Adoxa, Adoxa CK, Adoxa Pak, Adoxa TT, Alodox, Avidoxy, Doxal, Mondoxyne NL, Monodox, Morgidox 1x, Morgidox 1x Kit, Morgidox 2x, Morgidox 2x Kit, NutriDox, Ocudox, TARGADOX, Vibra-Tabs, Vibramycin What should I tell my health care provider before I take this medicine? They need to know if you have any of these conditions: -liver disease -long exposure to  sunlight like working outdoors -stomach problems like colitis -an unusual or allergic reaction to doxycycline, tetracycline antibiotics, other medicines, foods, dyes, or preservatives -pregnant or trying to get pregnant -breast-feeding How should I use this medicine? Take this medicine by mouth with a full glass of water. Follow the directions on the prescription label. It is best to take this medicine without food, but if it upsets your stomach take it with food. Take your medicine at regular intervals. Do not take your medicine more often than directed. Take all of your medicine as directed even if you think you are better. Do not skip doses or stop your medicine early. Talk to your pediatrician regarding the use of this medicine in children. While this drug may be prescribed for selected conditions, precautions do apply. Overdosage: If you think you have taken too much of this medicine contact a poison control center or emergency room at once. NOTE: This medicine is only for you. Do not share this medicine with others. What if I miss a dose? If you miss a dose, take it as soon as you can. If it is almost time for your next dose, take only that dose. Do not take double or extra doses. What may interact with this medicine? -antacids -barbiturates -birth control pills -bismuth subsalicylate -carbamazepine -methoxyflurane -other antibiotics -phenytoin -vitamins that contain iron -warfarin This list may not describe all possible interactions. Give your health care provider a list of all the medicines, herbs, non-prescription drugs, or dietary supplements you use. Also tell them if you smoke, drink alcohol, or use illegal drugs. Some items may interact with your medicine. What should I watch for while using this medicine? Tell your doctor or health care professional if your symptoms do not improve. Do not treat diarrhea with over the counter products. Contact your doctor if you have diarrhea  that lasts more than 2 days or if it is severe and watery. Do not take this medicine just before going to bed. It may not dissolve properly when you lay down and can cause pain in your throat. Drink plenty of fluids while taking this medicine to also help reduce irritation in your throat. This medicine can make you more sensitive to the sun. Keep out of the sun. If you cannot avoid being in the sun, wear protective clothing and use sunscreen. Do not use sun lamps or tanning beds/booths. Birth control pills may not work properly while you are taking this medicine. Talk to your doctor about using an extra method of birth control. If you are being treated for a sexually transmitted infection, avoid sexual contact until you have finished your treatment. Your sexual partner may also need treatment. Avoid antacids, aluminum, calcium, magnesium, and iron products for 4 hours before and 2 hours after taking a dose of this medicine. If you are using this medicine to prevent malaria, you should still protect yourself from contact with mosquitos. Stay in screened-in areas, use mosquito nets, keep your body covered, and use an insect repellent. What side effects may I notice from receiving this medicine? Side effects that you should report  to your doctor or health care professional as soon as possible: -allergic reactions like skin rash, itching or hives, swelling of the face, lips, or tongue -difficulty breathing -fever -itching in the rectal or genital area -pain on swallowing -redness, blistering, peeling or loosening of the skin, including inside the mouth -severe stomach pain or cramps -unusual bleeding or bruising -unusually weak or tired -yellowing of the eyes or skin Side effects that usually do not require medical attention (report to your doctor or health care professional if they continue or are bothersome): -diarrhea -loss of appetite -nausea, vomiting This list may not describe all possible  side effects. Call your doctor for medical advice about side effects. You may report side effects to FDA at 1-800-FDA-1088. Where should I keep my medicine? Keep out of the reach of children. Store at room temperature, below 30 degrees C (86 degrees F). Protect from light. Keep container tightly closed. Throw away any unused medicine after the expiration date. Taking this medicine after the expiration date can make you seriously ill. NOTE: This sheet is a summary. It may not cover all possible information. If you have questions about this medicine, talk to your doctor, pharmacist, or health care provider.  2018 Elsevier/Gold Standard (2015-06-22 17:11:22)  Ondansetron tablets What is this medicine? ONDANSETRON (on DAN se tron) is used to treat nausea and vomiting caused by chemotherapy. It is also used to prevent or treat nausea and vomiting after surgery. This medicine may be used for other purposes; ask your health care provider or pharmacist if you have questions. COMMON BRAND NAME(S): Zofran What should I tell my health care provider before I take this medicine? They need to know if you have any of these conditions: -heart disease -history of irregular heartbeat -liver disease -low levels of magnesium or potassium in the blood -an unusual or allergic reaction to ondansetron, granisetron, other medicines, foods, dyes, or preservatives -pregnant or trying to get pregnant -breast-feeding How should I use this medicine? Take this medicine by mouth with a glass of water. Follow the directions on your prescription label. Take your doses at regular intervals. Do not take your medicine more often than directed. Talk to your pediatrician regarding the use of this medicine in children. Special care may be needed. Overdosage: If you think you have taken too much of this medicine contact a poison control center or emergency room at once. NOTE: This medicine is only for you. Do not share this  medicine with others. What if I miss a dose? If you miss a dose, take it as soon as you can. If it is almost time for your next dose, take only that dose. Do not take double or extra doses. What may interact with this medicine? Do not take this medicine with any of the following medications: -apomorphine -certain medicines for fungal infections like fluconazole, itraconazole, ketoconazole, posaconazole, voriconazole -cisapride -dofetilide -dronedarone -pimozide -thioridazine -ziprasidone This medicine may also interact with the following medications: -carbamazepine -certain medicines for depression, anxiety, or psychotic disturbances -fentanyl -linezolid -MAOIs like Carbex, Eldepryl, Marplan, Nardil, and Parnate -methylene blue (injected into a vein) -other medicines that prolong the QT interval (cause an abnormal heart rhythm) -phenytoin -rifampicin -tramadol This list may not describe all possible interactions. Give your health care provider a list of all the medicines, herbs, non-prescription drugs, or dietary supplements you use. Also tell them if you smoke, drink alcohol, or use illegal drugs. Some items may interact with your medicine. What should I watch for while  using this medicine? Check with your doctor or health care professional right away if you have any sign of an allergic reaction. What side effects may I notice from receiving this medicine? Side effects that you should report to your doctor or health care professional as soon as possible: -allergic reactions like skin rash, itching or hives, swelling of the face, lips or tongue -breathing problems -confusion -dizziness -fast or irregular heartbeat -feeling faint or lightheaded, falls -fever and chills -loss of balance or coordination -seizures -sweating -swelling of the hands or feet -tightness in the chest -tremors -unusually weak or tired Side effects that usually do not require medical attention (report  to your doctor or health care professional if they continue or are bothersome): -constipation or diarrhea -headache This list may not describe all possible side effects. Call your doctor for medical advice about side effects. You may report side effects to FDA at 1-800-FDA-1088. Where should I keep my medicine? Keep out of the reach of children. Store between 2 and 30 degrees C (36 and 86 degrees F). Throw away any unused medicine after the expiration date. NOTE: This sheet is a summary. It may not cover all possible information. If you have questions about this medicine, talk to your doctor, pharmacist, or health care provider.  2018 Elsevier/Gold Standard (2013-02-25 16:27:45)

## 2016-09-25 NOTE — Progress Notes (Signed)
Family Medicine Teaching Service Daily Progress Note Intern Pager: [phone]  Patient name: Katelyn Peck Medical record number: [id number] Date of birth: Oct 23, 1990 Age: 26 y.o. Gender: female  Primary Care Provider: Pcp Not In System Consultants: None Code Status: Full  Assessment and Plan: 26 y.o. female presenting with left facial pain, swelling, and redness. No significant PMH.  Left Facial Cellulitis, improving: Improving leukocytosis 12.1>13.1>10>9.9. s/p tx with Vancomycin (4/21-4/24) and Zosyn (4/22-4/23) - Advanced diet as tolerated due to improvement in opening mouth and chewing  - Transition to PO Doxycycline 100 mg BID x 5 days   - Tylenol PRN pain - warm compresses for lip and ice for jaw    Nausea: -  Zofran 4 mg q6h PRN  FEN/GI: Advance diet as tolerated Prophylaxis: Lovenox   Disposition: Discharge home today. No PCP. CSW to provide list of physicians.   Subjective:  Reports swelling and pain are improved. Was able to eat dinner last night. Endorses nausea but no episodes of emesis. Nausea improved with zofran.  Tylenol improves pain. No other acute concerns.  Objective: Temp:  [97.4 F (36.3 C)-98 F (36.7 C)] 98 F (36.7 C) (04/24 0907) Pulse Rate:  [56-76] 61 (04/24 0907) Resp:  [18] 18 (04/24 0907) BP: (106-114)/(64-84) 106/64 (04/24 0907) SpO2:  [99 %-100 %] 99 % (04/24 0907) Weight:  [56.1 kg (123 lb 10.9 oz)] 56.1 kg (123 lb 10.9 oz) (04/23 2053) Physical Exam: General: 26yo female resting comfortably HEENT: Swelling to left jaw noted with increased tenderness, no areas of fluctuance noted. Please see pictures from H&P and progress notes. Cardiovascular: S1 and S2 noted, no murmurs, regular rate  Respiratory: Clear to auscultation bilaterally, no wheezes, no increased work of breathing Abdomen: Soft and nondistended, nontender  Laboratory:  Recent Labs Lab 09/23/16 0531 09/24/16 0737 09/25/16 0304  WBC 13.1* 10.0 9.9  HGB 12.8 12.0 12.6    HCT 39.2 36.5 37.8  PLT 271 269 279    Recent Labs Lab 09/22/16 1513 09/23/16 0531 09/25/16 0304  NA 137 137 139  K 3.8 3.8 3.9  CL 106 103 104  CO2 BUN 6 <5* 6  CREATININE 0.69 0.82 1.38*  CALCIUM 8.8* 8.7* 9.3  GLUCOSE 84 81 91   Imaging/Diagnostic Tests: No results found.   Katelyn Peck, Medical Student 09/25/2016, 10:15 AM  ________________________________________________________________________ I agree with the medical student's note above, with notable exceptions in the assessment and plan which I have given below.   Assessment and Plan: 26 y.o.femalepresenting with left facial pain, swelling, and redness. No significant PMH.  Left Facial Cellulitis, improving: Leukocytosis resolved this AM. Vitals stable.  - advance diet as tolerated - Vancomycin (4/21>>) > transition to PO Doxycycline  BID today for 5 more days - Tylenol PRN pain - warm compress  - social work to provide list of providers (patient is not followed by PCP)  FEN/GI: regular diet Prophylaxis: Lovenox   Dispo: likely DC 4/24  Physical Exam Gen: NAD Pulm: CTAB CV: RRR, no m/r/g Abd: soft, NT, ND, + bowel sounds Skin:        Riesa Pope, MD Family Medicine, PGY 2 09/25/2016 9:51 AM

## 2016-09-25 NOTE — Progress Notes (Signed)
Discharge instructions, RX's and follow up appts explained and provided to patient verbalized understanding. ° °Durene Dodge Lynn, RN ° °

## 2016-09-25 NOTE — Care Management Note (Signed)
Case Management Note  Patient Details  Name: KAYELYN LEMON MRN: [id number] Date of Birth: 01-Feb-1991  Subjective/Objective:         CM following for progression and d/c planning.            Action/Plan: 09/25/2016 Met with pt and family member, explained to pt that since she has commercial insurance she should contact her insurance company re local PCP, also provided pt with phone number for [facility]. She may be able to obtain the names and numbers of local MDs who are accepting new patients. She would need to inquire re if the MD accepts her insurance.  This was explained to the pt who verbalized understanding.   Expected Discharge Date:  09/25/2016               Expected Discharge Plan:  Home/Self Care  In-House Referral:  NA  Discharge planning Services  CM Consult  Post Acute Care Choice:  NA Choice offered to:  NA  DME Arranged:  N/A DME Agency:  NA  HH Arranged:  NA HH Agency:  NA  Status of Service:  Completed, signed off  If discussed at [place] of Stay Meetings, dates discussed:    Additional Comments:  Adron Bene, RN 09/25/2016, 10:31 AM

## 2016-09-25 NOTE — Discharge Summary (Signed)
Family Medicine Teaching [facility] Discharge Summary  Patient name: Katelyn Peck Medical record number: [id number] Date of birth: Jun 22, 1990 Age: 26 y.o. Gender: female Date of Admission: 09/22/2016  Date of Discharge: 09/25/16 Admitting Physician: Leighton Roach McDiarmid, MD  Primary Care Provider: Pcp Not In System Consultants: None  Indication for Hospitalization: Left facial cellulitis requiring IV antibiotics   Discharge Diagnoses/Problem List:  Left facial cellulitis  Nausea AKI  Disposition: Home  Discharge Condition: Stable  Discharge Exam:  General: 26yo female resting comfortably HEENT: Swelling to left jaw noted with minimal tenderness and no areas of fluctuance noted. Please see pictures from H&P and progress notes. Cardiovascular: S1 and S2 noted, no murmurs, regular rate  Respiratory: Clear to auscultation bilaterally, no wheezes, no increased work of breathing Abdomen: Soft and nondistended, non-tender, normoactive bowel sounds         Brief Hospital Course:  Katelyn Peck is a 26 y.o. female presenting with left facial pain, swelling, and redness. No significant PMH  Left Facial Cellulitis, improving: On admission, her vitals were stable and she had a mild leukocytosis to 12.1. CT maxillofacial noting cellulitis with a possible small developing abscess in/just above the upper lip but no clearly drainable fluid collection identified. No signs/symptoms of orbital or periorbital cellulitis. She was started on vancomycin and morphine PRN for pain. Her pain was not improved with the morphine so she was switched to IV Dilaudid with better pain control.  She was transitioned to PO Doxycycline after she was able to tolerate PO intake. On discharge, her vitals were stable, leukocytosis was resolved and her left facial cellulitis was stable and much improved from admission. Patient was discharged with 5 days of Doxycycline to complete total 7 days of antibiotics.   Nausea 2/2  decreased PO intake and IV abx, improving: During her stay she was nauseous without emesis. She was given Zofran PRN with resolution of her nausea. Initially she had decreased PO intake because of her facial cellulitis which improved by time of discharge. Over the course of her stay she only required two doses of Zofran PRN.   New onset AKI, most likely 2/2 vancomycin: On hospital day 3, her AM BMP showed a Cr 1.38. Baseline was 0.8. She was able to tolerate PO intake and had adequate urine output throughout her stay.   Issues for Follow Up:  1. Left facial cellulitis - follow up on improvement/resolution of cellulitis 2. AKI - Get repeat Cr. Follow up on improvement/resolution of AKI   Significant Procedures: None  Significant Labs and Imaging:  Results for orders placed or performed during the hospital encounter of 09/22/16  Basic metabolic panel  Result Value Ref Range   Sodium 137 135 - 145 mmol/L   Potassium 3.8 3.5 - 5.1 mmol/L   Chloride 106 101 - 111 mmol/L   CO2 26 22 - 32 mmol/L   Glucose, Bld 84 65 - 99 mg/dL   BUN 6 6 - 20 mg/dL   Creatinine, Ser 4.09 0.44 - 1.00 mg/dL   Calcium 8.8 (L) 8.9 - 10.3 mg/dL   GFR calc non Af Amer >60 >60 mL/min   GFR calc Af Amer >60 >60 mL/min   Anion gap 5 5 - 15  CBC with Differential  Result Value Ref Range   WBC 12.1 (H) 4.0 - 10.5 K/uL   RBC 4.90 3.87 - 5.11 MIL/uL   Hemoglobin 12.6 12.0 - 15.0 g/dL   HCT 81.1 91.4 - 78.2 %  MCV 77.8 (L) 78.0 - 100.0 fL   MCH 25.7 (L) 26.0 - 34.0 pg   MCHC 33.1 30.0 - 36.0 g/dL   RDW 25.3 66.4 - 40.3 %   Platelets 273 150 - 400 K/uL   Neutrophils Relative % 75 %   Neutro Abs 9.2 (H) 1.7 - 7.7 K/uL   Lymphocytes Relative 17 %   Lymphs Abs 2.0 0.7 - 4.0 K/uL   Monocytes Relative 6 %   Monocytes Absolute 0.7 0.1 - 1.0 K/uL   Eosinophils Relative 2 %   Eosinophils Absolute 0.2 0.0 - 0.7 K/uL   Basophils Relative 0 %   Basophils Absolute 0.0 0.0 - 0.1 K/uL  HIV antibody (Routine Testing)   Result Value Ref Range   HIV Screen 4th Generation wRfx Non Reactive Non Reactive  CBC  Result Value Ref Range   WBC 13.1 (H) 4.0 - 10.5 K/uL   RBC 5.04 3.87 - 5.11 MIL/uL   Hemoglobin 12.8 12.0 - 15.0 g/dL   HCT 47.4 25.9 - 56.3 %   MCV 77.8 (L) 78.0 - 100.0 fL   MCH 25.4 (L) 26.0 - 34.0 pg   MCHC 32.7 30.0 - 36.0 g/dL   RDW 87.5 64.3 - 32.9 %   Platelets 271 150 - 400 K/uL  Basic metabolic panel  Result Value Ref Range   Sodium 137 135 - 145 mmol/L   Potassium 3.8 3.5 - 5.1 mmol/L   Chloride 103 101 - 111 mmol/L   CO2 25 22 - 32 mmol/L   Glucose, Bld 81 65 - 99 mg/dL   BUN <5 (L) 6 - 20 mg/dL   Creatinine, Ser 5.18 0.44 - 1.00 mg/dL   Calcium 8.7 (L) 8.9 - 10.3 mg/dL   GFR calc non Af Amer >60 >60 mL/min   GFR calc Af Amer >60 >60 mL/min   Anion gap 9 5 - 15  CBC  Result Value Ref Range   WBC 10.0 4.0 - 10.5 K/uL   RBC 4.69 3.87 - 5.11 MIL/uL   Hemoglobin 12.0 12.0 - 15.0 g/dL   HCT 84.1 66.0 - 63.0 %   MCV 77.8 (L) 78.0 - 100.0 fL   MCH 25.6 (L) 26.0 - 34.0 pg   MCHC 32.9 30.0 - 36.0 g/dL   RDW 16.0 10.9 - 32.3 %   Platelets 269 150 - 400 K/uL  CBC  Result Value Ref Range   WBC 9.9 4.0 - 10.5 K/uL   RBC 4.92 3.87 - 5.11 MIL/uL   Hemoglobin 12.6 12.0 - 15.0 g/dL   HCT 55.7 32.2 - 02.5 %   MCV 76.8 (L) 78.0 - 100.0 fL   MCH 25.6 (L) 26.0 - 34.0 pg   MCHC 33.3 30.0 - 36.0 g/dL   RDW 42.7 06.2 - 37.6 %   Platelets 279 150 - 400 K/uL  Basic metabolic panel  Result Value Ref Range   Sodium 139 135 - 145 mmol/L   Potassium 3.9 3.5 - 5.1 mmol/L   Chloride 104 101 - 111 mmol/L   CO2 27 22 - 32 mmol/L   Glucose, Bld 91 65 - 99 mg/dL   BUN 6 6 - 20 mg/dL   Creatinine, Ser 2.83 (H) 0.44 - 1.00 mg/dL   Calcium 9.3 8.9 - 15.1 mg/dL   GFR calc non Af Amer 53 (L) >60 mL/min   GFR calc Af Amer >60 >60 mL/min   Anion gap 8 5 - 15  I-Stat beta hCG blood, ED  Result Value  Ref Range   I-stat hCG, quantitative <5.0 <5 mIU/mL   Comment 3            Results/Tests  Pending at Time of Discharge: None  Discharge Medications:  Allergies as of 09/25/2016   No Known Allergies     Medication List    STOP taking these medications   ibuprofen 200 MG tablet Commonly known as:  ADVIL,MOTRIN   meloxicam 7.5 MG tablet Commonly known as:  MOBIC   sulfamethoxazole-trimethoprim 800-160 MG tablet Commonly known as:  BACTRIM DS,SEPTRA DS     TAKE these medications   acetaminophen 325 MG tablet Commonly known as:  TYLENOL Take 2 tablets (650 mg total) by mouth every 6 (six) hours as needed.   diphenhydrAMINE 25 MG tablet Commonly known as:  BENADRYL Take 25 mg by mouth once.   doxycycline 100 MG tablet Commonly known as:  VIBRA-TABS Take 1 tablet (100 mg total) by mouth every 12 (twelve) hours.   ondansetron 4 MG tablet Commonly known as:  ZOFRAN Take 1 tablet (4 mg total) by mouth every 8 (eight) hours as needed for nausea or vomiting.       Discharge Instructions: Please refer to Patient Instructions section of EMR for full details.  Patient was counseled important signs and symptoms that should prompt return to medical care, changes in medications, dietary instructions, activity restrictions, and follow up appointments.   Follow-Up Appointments: Follow-up Information    Freddrick March, MD Follow up on 09/28/2016.   Why:  on 9:30AM for hospital follow up visit.  Contact information: [address] [place] [address] [phone]

## 2016-09-28 ENCOUNTER — Encounter: Payer: Self-pay | Admitting: Family Medicine

## 2016-09-28 ENCOUNTER — Ambulatory Visit (INDEPENDENT_AMBULATORY_CARE_PROVIDER_SITE_OTHER): Payer: BLUE CROSS/BLUE SHIELD | Admitting: Family Medicine

## 2016-09-28 VITALS — BP 114/66 | HR 75 | Temp 97.8°F | Ht 61.0 in | Wt 117.0 lb

## 2016-09-28 DIAGNOSIS — N179 Acute kidney failure, unspecified: Secondary | ICD-10-CM

## 2016-09-28 DIAGNOSIS — L03211 Cellulitis of face: Secondary | ICD-10-CM

## 2016-09-28 NOTE — Patient Instructions (Addendum)
It was really nice meeting you today! You were seen in clinic for hospital follow up after being discharged for facial cellulitis.  I'm glad your infection is improving and looking a lot better today.  You can continue to take the antibiotic prescribed to you (Doxycycline) to complete the full course.   Take Tylenol as needed for the pain as you have been doing.    Additionally, I checked your kidney function today as we had discussed since it was a little elevated during your hospitalization.  I will follow up with you if this is still a concern.    You can make an appointment to be seen in clinic if you feel as though the infection is worsening or if you are having fevers.   Be well,  Freddrick March, MD

## 2016-09-28 NOTE — Progress Notes (Signed)
   Subjective:   Patient ID: Katelyn Peck    DOB: [birth year], 26 y.o. female   MRN: [id number]  CC: hospital follow-up   HPI: Katelyn Peck is a 26 y.o. female who presents to clinic today for follow up facial cellulitis.   Left facial cellulitis Patient reports her infection is improving and almost completely cleared.  Discharged with 5 days of doxycycline to complete total 7 days of antibiotics.   Is taking abx as prescribed and taking Tylenol as needed for the pain.  Area no longer hurts her and she has not been febrile.  Felt a little warm at home but no fevers.   ROS: Denies fevers, chills, nausea, vomiting, diarrhea.    ROS: See HPI for pertinent ROS.  PMFSH: Pertinent past medical, surgical, family, and social history were reviewed and updated as appropriate. Smoking status reviewed.  Medications reviewed.  Objective:   BP 114/66   Pulse 75   Temp 97.8 F (36.6 C) (Oral)   Ht  (1.549 m)   Wt 117 lb (53.1 kg)   LMP 09/19/2016 (Exact Date)   SpO2 99%   BMI 22.11 kg/m  Vitals and nursing note reviewed.  General: 26 yo F, NAD  HEENT: NCAT, MMM, o/p clear , minimal erythema over L upper lip, no warmth or swelling noticed, no tenderness to palpation Neck: supple CV: RRR no MRG  Lungs: CTA b/l with normal work of breathing  Skin: warm, dry     Assessment & Plan:   AKI (acute kidney injury) (HCC) Elevated Cr to 1.38 during hospitalization (BL 0.5-0.6)  -Likely 2/2 decreased po intake and Vancomycin  -BMET today to check -Advised hydration    Cellulitis of face Much improved from previous photograph.  Patient has remained afebrile at home and tolerating antibiotics well.  -Advised continue Doxycycline for total 7 day course -Tylenol as needed for pain relief -May use a scar cream to reduce appearance of affected area -Return precautions discussed  Orders Placed This Encounter  Procedures  . Basic metabolic panel    Freddrick March, MD [facility]  [facility], PGY-1 10/02/2016 6:34 PM

## 2016-09-29 LAB — BASIC METABOLIC PANEL
BUN / CREAT RATIO: 15 (ref 9–23)
BUN: 17 mg/dL (ref 6–20)
CHLORIDE: 100 mmol/L (ref 96–106)
CO2: 22 mmol/L (ref 18–29)
Calcium: 9.4 mg/dL (ref 8.7–10.2)
Creatinine, Ser: 1.12 mg/dL — ABNORMAL HIGH (ref 0.57–1.00)
GFR calc non Af Amer: 68 mL/min/{1.73_m2} (ref >59)
GFR, EST AFRICAN AMERICAN: 79 mL/min/{1.73_m2} (ref >59)
Glucose: 81 mg/dL (ref 65–99)
POTASSIUM: 4.3 mmol/L (ref 3.5–5.2)
SODIUM: 138 mmol/L (ref 134–144)

## 2016-10-02 DIAGNOSIS — N179 Acute kidney failure, unspecified: Secondary | ICD-10-CM | POA: Insufficient documentation

## 2016-10-02 NOTE — Assessment & Plan Note (Addendum)
Elevated Cr to 1.38 during hospitalization (BL 0.5-0.6)  -Likely 2/2 decreased po intake and Vancomycin  -BMET today to check -Advised hydration

## 2016-10-02 NOTE — Assessment & Plan Note (Addendum)
Much improved from previous photograph.  Patient has remained afebrile at home and tolerating antibiotics well.  -Advised continue Doxycycline for total 7 day course -Tylenol as needed for pain relief -May use a scar cream to reduce appearance of affected area -Return precautions discussed

## 2016-11-14 ENCOUNTER — Encounter: Payer: Self-pay | Admitting: Family Medicine

## 2016-11-14 ENCOUNTER — Ambulatory Visit (INDEPENDENT_AMBULATORY_CARE_PROVIDER_SITE_OTHER): Payer: BLUE CROSS/BLUE SHIELD | Admitting: Family Medicine

## 2016-11-14 VITALS — BP 118/62 | HR 66 | Temp 98.2°F | Ht 61.0 in | Wt 127.4 lb

## 2016-11-14 DIAGNOSIS — L03211 Cellulitis of face: Secondary | ICD-10-CM | POA: Diagnosis not present

## 2016-11-14 MED ORDER — DOXYCYCLINE HYCLATE 100 MG PO TABS: 100.0000 mg | ORAL_TABLET | Freq: Two times a day (BID) | ORAL | 0 refills | Status: DC

## 2016-11-14 NOTE — Patient Instructions (Signed)
We will give you a shot of Ceftriaxone today and start another course of Doxycycline. Please return tomorrow. Please go to the ED if worsening occurs overnight.  Dr. Caroleen Hammanumley

## 2016-11-15 ENCOUNTER — Encounter: Payer: Self-pay | Admitting: Family Medicine

## 2016-11-15 ENCOUNTER — Ambulatory Visit (INDEPENDENT_AMBULATORY_CARE_PROVIDER_SITE_OTHER): Payer: BLUE CROSS/BLUE SHIELD | Admitting: Family Medicine

## 2016-11-15 DIAGNOSIS — L03211 Cellulitis of face: Secondary | ICD-10-CM | POA: Diagnosis not present

## 2016-11-15 MED ORDER — CEFTRIAXONE SODIUM 1 G IJ SOLR
1.0000 g | Freq: Once | INTRAMUSCULAR | Status: AC
  Administered 2016-11-14: 1 g via INTRAMUSCULAR

## 2016-11-15 NOTE — Progress Notes (Signed)
Subjective:     Patient ID: Katelyn Peck, female   DOB: 03/01/1991, 26 y.o.   MRN: [id number]  HPI [name] is a 26yo female presenting today for facial cellulitis. Recent history of hospitalization from 4/21-4/24 for facial cellulitis, initially treated with vancomycin and then transitioned to doxycycline. Completed course of Doxycycline. Reports yesterday a new lesion appeared right in the middle of her upper lip. Was painful yesterday, but seems to be swelling even more today. States this is exactly how her last episode of facial cellulitis started. Denies fever. Denies drainage. Denies rashes elsewhere. Has a residual area of hyperpigmentation for previous lesion. Nonsmoker.  Review of Systems Per HPI    Objective:   Physical Exam  Constitutional: She appears well-developed and well-nourished. No distress.  Cardiovascular: Normal rate and regular rhythm.   No murmur heard. Pulmonary/Chest: Effort normal. No respiratory distress. She has no wheezes.  Skin:  362mmx2mm pustule with slight surrounding erythema noted with 1cmx1cm area of induration underneath. Please see picture below.  Psychiatric: She has a normal mood and affect. Her behavior is normal.  Tearful       Assessment and Plan:     1. Facial cellulitis Recommend more aggressive treatment given recent hospitalization for same. Refuses CBC, but consider if no improvement. Dose of Ceftriaxone given. Initiated on Doxycycline to complete 10 day course. Offered to unroof after precepting with Dr. Jennette KettleNeal, however patient wishes to try antibiotics and then consider further intervention at next office visit. Recommend close follow up on 6/14. To go to ED if progresses significantly prior to next visit.

## 2016-11-15 NOTE — Progress Notes (Signed)
   HPI  CC: Cellulitis/folliculitis Patient is here for follow-up with her cellulitis/folliculitis. She states that she feels well this morning. Slight improvement from yesterday. She had not picked up the doxycycline that had been called in yesterday. She states that she did not know of this medication. Tenderness is improved and she feels well overall. She denies any fevers, chills, headache, blurred vision, nausea, vomiting, diarrhea, weakness, numbness, or fatigue.  Review of Systems See HPI for ROS.   CC, SH/smoking status, and VS noted  Objective: BP 104/60 (BP Location: Left Arm, Patient Position: Sitting, Cuff Size: Normal)   Pulse 85   Temp 98.2 F (36.8 C) (Oral)   Wt 127 lb (57.6 kg)   BMI 24.00 kg/m  Gen: NAD, alert, cooperative. CV: Well-perfused. Resp: Non-labored. Neuro: Sensation intact throughout. Integument: 402mmx2mm pustule with slight surrounding erythema noted with slight region of surrounding induration. No surrounding erythema. No involvement of the mucosal surface within the mouth.   Assessment and plan:  Cellulitis of face Patient is here for a follow-up on her facial infection. Slight improvement reported by patient compared to yesterday. Per the picture from yesterday I would agree. Patient has not picked up doxycycline due to some communication error. No red flag symptoms at this time. - Encouraged picking up doxycycline. 1 tablet twice a day 10 days - Discussed proper facial skin hygiene. Non-scented facial soap to be used once daily. Encouraged use of facemask while at work (employed at [employer]). - Follow-up when necessary   Kathee DeltonIan D McKeag, MD,MS,  PGY3 11/15/2016 12:38 PM

## 2016-11-15 NOTE — Addendum Note (Signed)
Addended by: Georges LynchSAUNDERS, Artez Regis T on: 11/15/2016 09:40 AM   Modules accepted: Orders

## 2016-11-15 NOTE — Assessment & Plan Note (Signed)
Patient is here for a follow-up on her facial infection. Slight improvement reported by patient compared to yesterday. Per the picture from yesterday I would agree. Patient has not picked up doxycycline due to some communication error. No red flag symptoms at this time. - Encouraged picking up doxycycline. 1 tablet twice a day 10 days - Discussed proper facial skin hygiene. Non-scented facial soap to be used once daily. Encouraged use of facemask while at work (employed at [employer]). - Follow-up when necessary

## 2016-11-15 NOTE — Patient Instructions (Signed)
It was a pleasure seeing you today in our clinic. Today we discussed your skin infection. Here is the treatment plan we have discussed and agreed upon together:   - Begin taking the doxycycline tablets. Take 1 tablet twice a day for the next 10 days. - I would start using a facial soap once a day. This does not need to be an expensive soap and may actually be more appropriate fit was a non-scented type. Lather, apply, and rinse off every night before bed. - Come back for reevaluation in 1-2 weeks if your symptoms do not improve.

## 2017-03-14 ENCOUNTER — Encounter (HOSPITAL_COMMUNITY): Payer: Self-pay | Admitting: Family Medicine

## 2017-03-14 ENCOUNTER — Ambulatory Visit (HOSPITAL_COMMUNITY)
Admission: EM | Admit: 2017-03-14 | Discharge: 2017-03-14 | Disposition: A | Discharge status: Home / Self Care | Payer: BLUE CROSS/BLUE SHIELD | Attending: Family Medicine | Admitting: Family Medicine

## 2017-03-14 DIAGNOSIS — H6012 Cellulitis of left external ear: Secondary | ICD-10-CM | POA: Diagnosis not present

## 2017-03-14 LAB — POCT PREGNANCY, URINE: PREG TEST UR: NEGATIVE

## 2017-03-14 MED ORDER — NEOMYCIN-POLYMYXIN-HC 3.5-10000-1 OT SUSP: 4.0000 [drp] | Freq: Three times a day (TID) | OTIC | 0 refills | Status: DC

## 2017-03-14 MED ORDER — DOXYCYCLINE HYCLATE 100 MG PO TABS: 100.0000 mg | ORAL_TABLET | Freq: Two times a day (BID) | ORAL | 0 refills | Status: DC

## 2017-03-14 NOTE — ED Notes (Signed)
Dr. Milus Glazier sees PT prior to triage

## 2017-03-14 NOTE — ED Provider Notes (Signed)
  [facility]   [id number] 03/14/17 Arrival Time: 1214   SUBJECTIVE:  Katelyn Peck is a 26 y.o. female who presents to the urgent care with complaint of left ear pain for a little over a week.  It hurts to move the ear or chew on anything.  Patient does use Q-tips frequently.  Unsure if pregnant: LMP - last month    History reviewed. No pertinent past medical history. No family history on file. Social History   Social History  . Marital status: Single    Spouse name: N/A  . Number of children: N/A  . Years of education: N/A   Occupational History  . Not on file.   Social History Main Topics  . Smoking status: Never Smoker  . Smokeless tobacco: Never Used  . Alcohol use No  . Drug use: Unknown  . Sexual activity: Yes   Other Topics Concern  . Not on file   Social History Narrative  . No narrative on file   No outpatient prescriptions have been marked as taking for the 03/14/17 encounter [facility] Encounter).   No Known Allergies    ROS: As per HPI, remainder of ROS negative.   OBJECTIVE:   Vitals:   03/14/17 1246  BP: 123/81  Pulse: 75  Resp: 16  Temp: 98.3 F (36.8 C)  TempSrc: Oral  SpO2: 99%  Weight: 127 lb (57.6 kg)  Height: 5' (1.524 m)     General appearance: alert; no distress Eyes: PERRL; EOMI; conjunctiva normal HENT: normocephalic; atraumatic; TMs normal, left canal shows marked swelling posteriorly, external ears normal without trauma; nasal mucosa normal; oral mucosa normal Neck: supple Extremities: no cyanosis or edema; symmetrical with no gross deformities Skin: warm and dry Neurologic: normal gait; grossly normal Psychological: alert and cooperative; normal mood and affect      Labs:  Results for orders placed or performed in visit on 09/28/16  Basic metabolic panel  Result Value Ref Range   Glucose 81 65 - 99 mg/dL   BUN 17 6 - 20 mg/dL   Creatinine, Ser 3.47 (H) 0.57 - 1.00 mg/dL   GFR calc non Af Amer  68 >59 mL/min/1.73   GFR calc Af Amer 79 >59 mL/min/1.73   BUN/Creatinine Ratio 15 9 - 23   Sodium 138 134 - 144 mmol/L   Potassium 4.3 3.5 - 5.2 mmol/L   Chloride 100 96 - 106 mmol/L   CO2 22 18 - 29 mmol/L   Calcium 9.4 8.7 - 10.2 mg/dL    Labs Reviewed - No data to display  No results found.     ASSESSMENT & PLAN:  1. Cellulitis of left ear canal     Meds ordered this encounter  Medications  . neomycin-polymyxin-hydrocortisone (CORTISPORIN) 3.5-10000-1 OTIC suspension    Sig: Place 4 drops into the left ear 3 (three) times daily.    Dispense:  10 mL    Refill:  0  . doxycycline (VIBRA-TABS) 100 MG tablet    Sig: Take 1 tablet (100 mg total) by mouth 2 (two) times daily.    Dispense:  20 tablet    Refill:  0    Reviewed expectations re: course of current medical issues. Questions answered. Outlined signs and symptoms indicating need for more acute intervention. Patient verbalized understanding. After Visit Summary given.    Procedures:      Elvina Sidle, MD 03/14/17 1257

## 2017-12-03 IMAGING — CT CT MAXILLOFACIAL W/ CM
3 series · 15 of 47 positions shown, 18 images · IV contrast (Omni 300)
Comparison: None.

CLINICAL DATA: Facial swelling.  Left upper lip abscess.

EXAM:
CT MAXILLOFACIAL WITH CONTRAST
TECHNIQUE: Multidetector CT imaging of the maxillofacial structures was
performed with intravenous contrast. Multiplanar CT image
reconstructions were also generated. A small metallic BB was placed
on the right temple in order to reliably differentiate right from
left.
CONTRAST:  75mL 1H4SBO-0VV IOPAMIDOL (1H4SBO-0VV) INJECTION 61%

[Series 3: facialbone 2.0 st · axial · 0.32mm/px · z∈[-191,-47]mm · 9 of 84 slices shown, 12 images]
[im 6/84  brain]
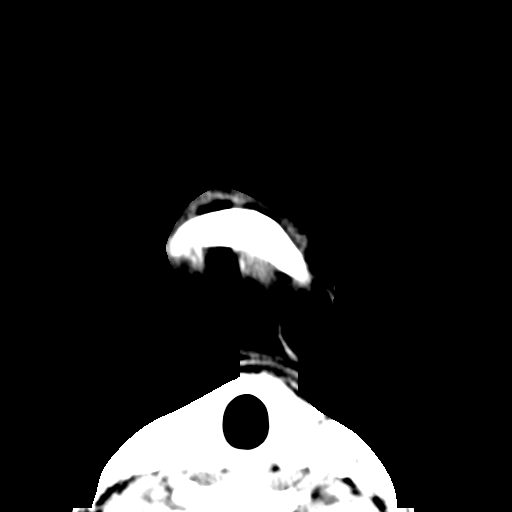
[im 6/84  bone]
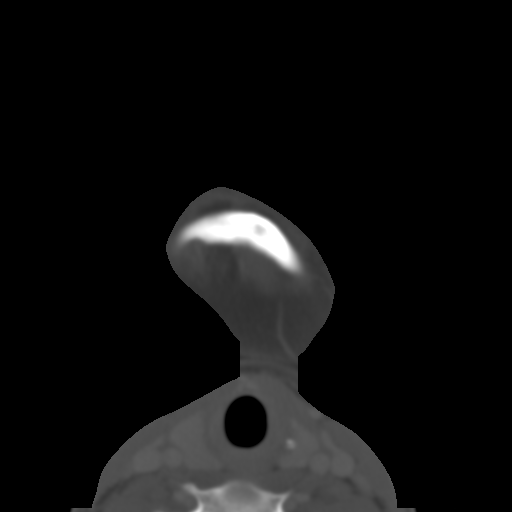
[im 15/84  bone]
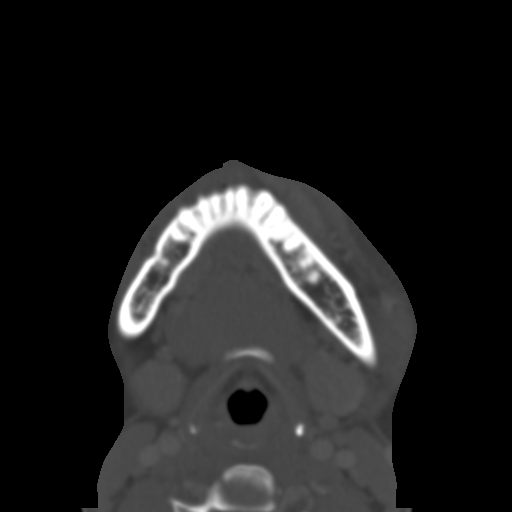
[im 23/84  bone]
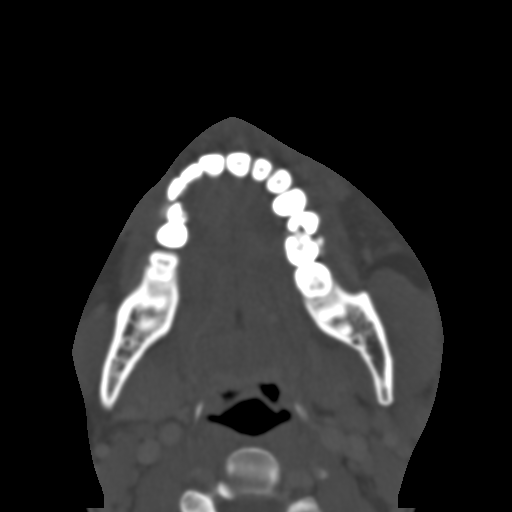
[im 32/84  bone]
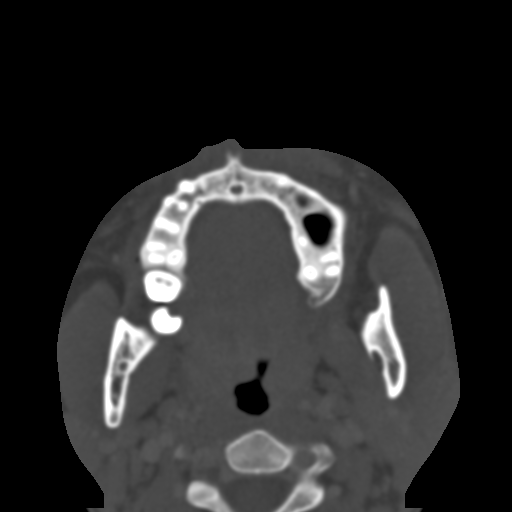
[im 43/84  brain]
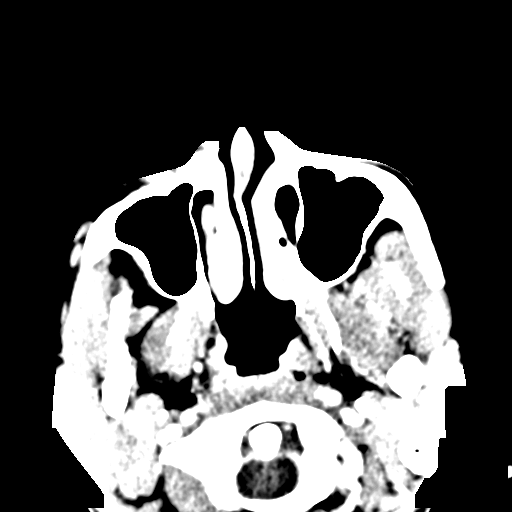
[im 43/84  bone]
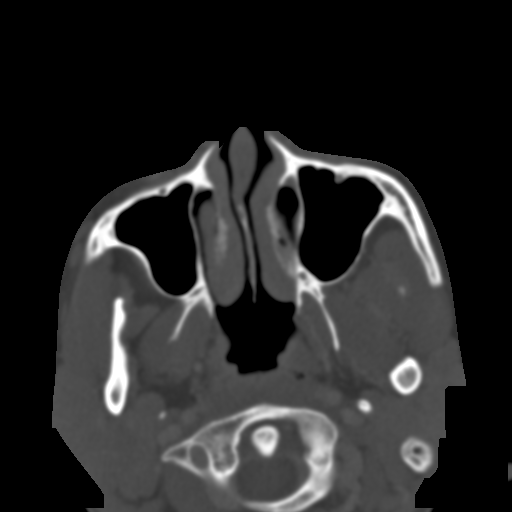
[im 52/84  bone]
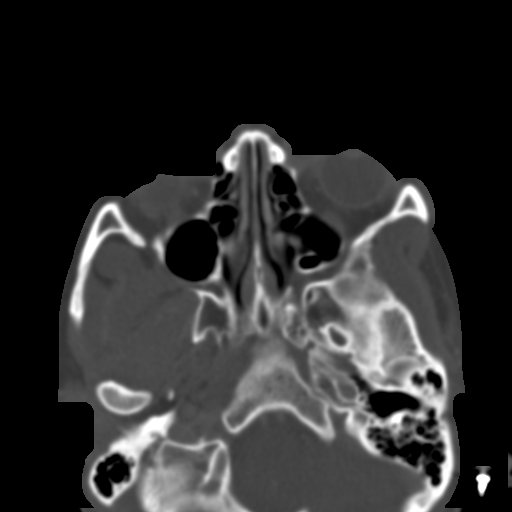
[im 61/84  bone]
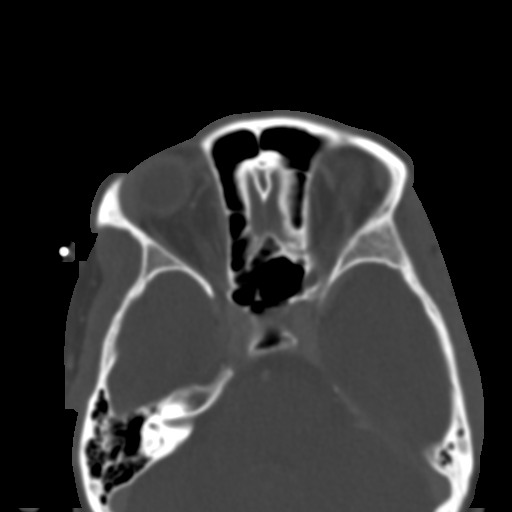
[im 69/84  bone]
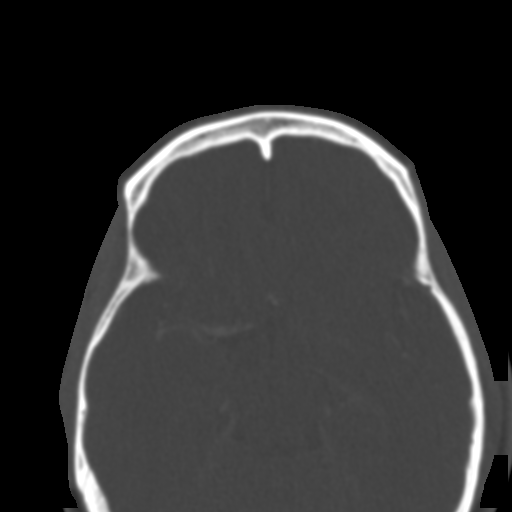
[im 78/84  brain]
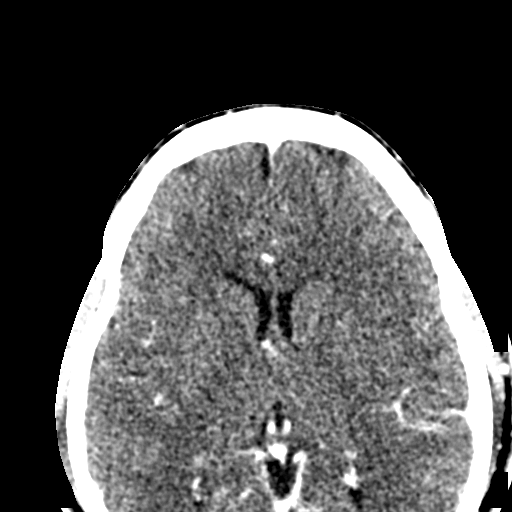
[im 78/84  bone]
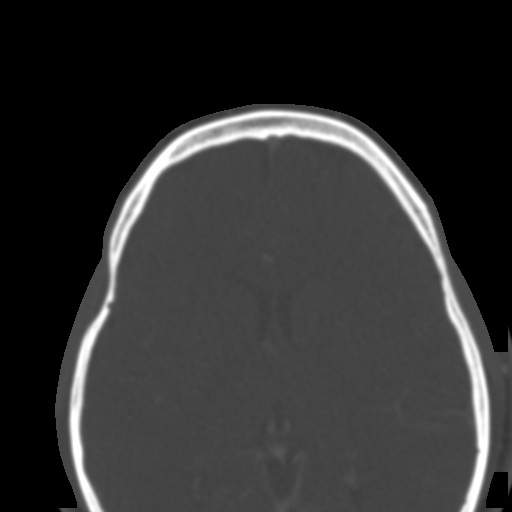

[Series 7: facialbone 2.0 cor st · coronal · 0.37mm/px · 3 of 83 slices shown]
[im 28/83  bone]
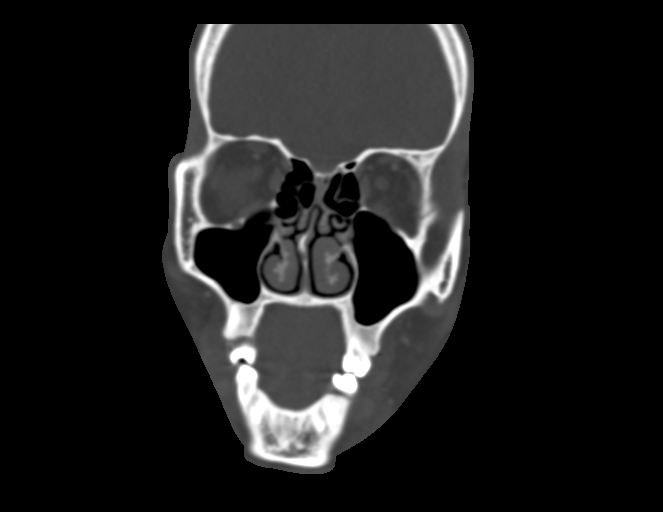
[im 37/83  bone]
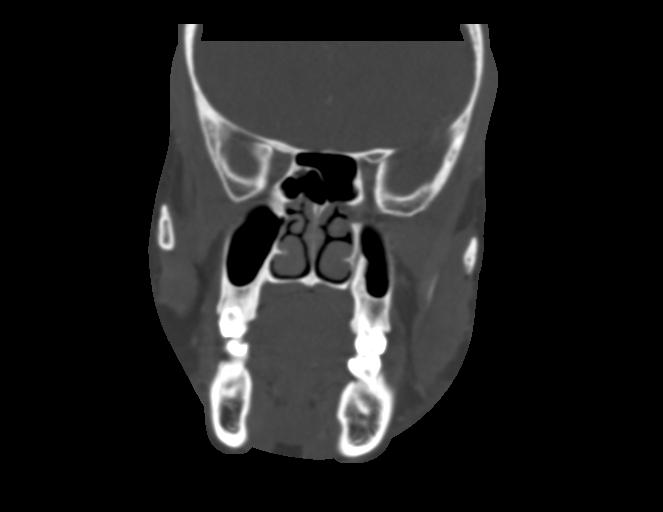
[im 46/83  bone]
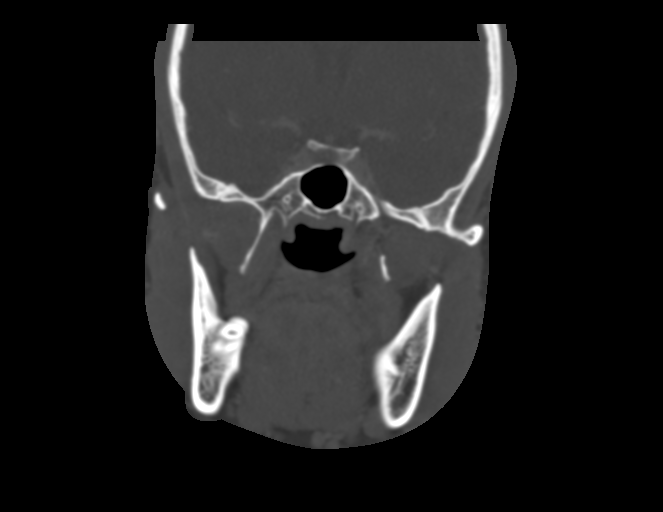

[Series 8: facialbone 2.0 sag st · sagittal · 0.38mm/px · 3 of 76 slices shown]
[im 26/76  bone]
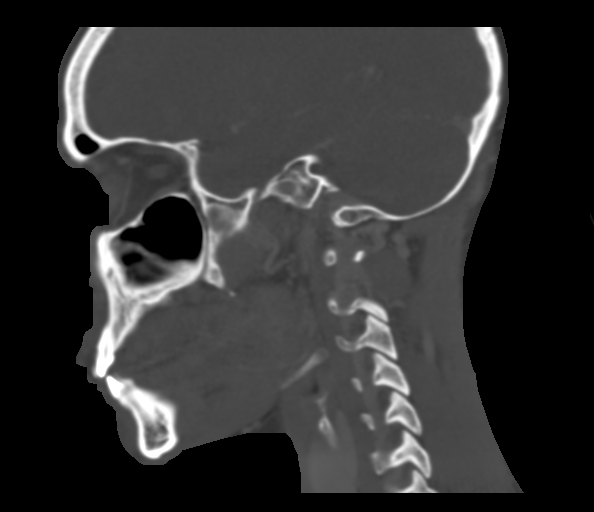
[im 38/76  bone]
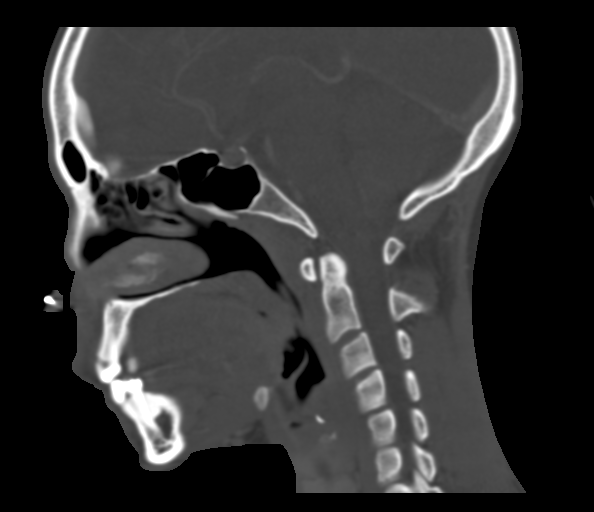
[im 51/76  bone]
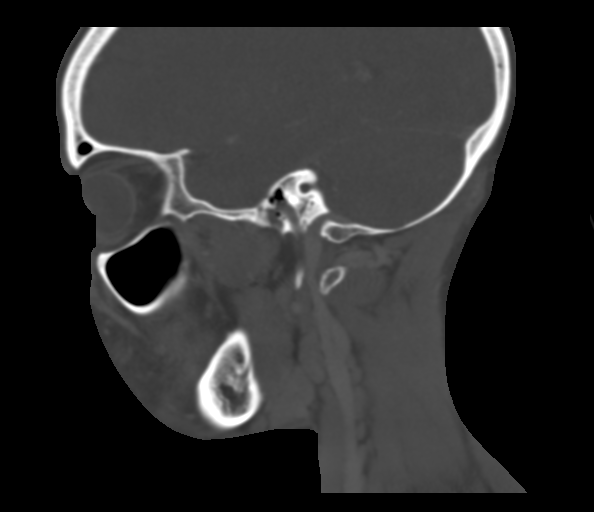

[15 of 47 positions shown; findings below may reference images not displayed]

FINDINGS: Osseous: No evidence of fracture or destructive osseous process. No
significant dental disease identified. Temporomandibular joints are
located.

Orbits: Intact globes.  No inflammatory changes.

Sinuses: Paranasal sinuses and mastoid air cells are clear. There is
slight rightward nasal septal deviation.

Soft tissues: There is moderate diffuse soft tissue swelling
involving the upper lip. To the left of midline at this level is a
2.1 x 0.4 cm region of slightly lower density just below the skin
surface which may reflect a small developing abscess, however a well
organized, sizeable, clearly drainable fluid collection is not
identified. Soft tissue swelling extends into the subcutaneous
tissues of the left face.

Limited intracranial: Unremarkable.
IMPRESSION: Diffuse swelling of the upper lip extending into the left face
compatible with cellulitis. Possible small developing abscess
in/just above the left upper lip, however no sizable/clearly
drainable fluid collection is yet identified.

## 2018-03-07 ENCOUNTER — Encounter (HOSPITAL_COMMUNITY): Payer: Self-pay | Admitting: Emergency Medicine

## 2018-03-07 ENCOUNTER — Other Ambulatory Visit: Payer: Self-pay

## 2018-03-07 ENCOUNTER — Ambulatory Visit (HOSPITAL_COMMUNITY)
Admission: EM | Admit: 2018-03-07 | Discharge: 2018-03-07 | Disposition: A | Discharge status: Home / Self Care | Payer: BLUE CROSS/BLUE SHIELD | Attending: Family Medicine | Admitting: Family Medicine

## 2018-03-07 DIAGNOSIS — L03211 Cellulitis of face: Secondary | ICD-10-CM | POA: Diagnosis present

## 2018-03-07 LAB — CBC
HCT: 39.4 % (ref 36.0–46.0)
Hemoglobin: 12.6 g/dL (ref 12.0–15.0)
MCH: 25 pg — AB (ref 26.0–34.0)
MCHC: 32 g/dL (ref 30.0–36.0)
MCV: 78.3 fL (ref 78.0–100.0)
Platelets: 323 10*3/uL (ref 150–400)
RBC: 5.03 MIL/uL (ref 3.87–5.11)
RDW: 13.1 % (ref 11.5–15.5)
WBC: 8.5 10*3/uL (ref 4.0–10.5)

## 2018-03-07 MED ORDER — DOXYCYCLINE HYCLATE 100 MG PO CAPS: 100.0000 mg | ORAL_CAPSULE | Freq: Two times a day (BID) | ORAL | 0 refills | Status: AC

## 2018-03-07 NOTE — ED Provider Notes (Signed)
[facility]    CSN: [id number] Arrival date & time: 03/07/18  1415     History   Chief Complaint Chief Complaint  Patient presents with  . Recurrent Skin Infections    HPI Katelyn Peck is a 27 y.o. female no significant past medical history presenting today for evaluation of cellulitis to her face.  Patient states that she has had this issue 2-3 times in the past.  The first time she had this led to admission as she had swelling into the side of her face and neck.  Her symptoms began approximately 2 days ago and she is trying to catch this early.  Each time she has had this she has noticed this after she has returned from [place].  She denies fevers, night sweats, chills, body aches.  She has been applying acne cream to this area.  Patient requesting blood work to ensure it is not spread to her bloodstream.  HPI  History reviewed. No pertinent past medical history.  Patient Active Problem List   Diagnosis Date Noted  . Cellulitis of face 09/22/2016    History reviewed. No pertinent surgical history.  OB History   None      Home Medications    Prior to Admission medications   Medication Sig Start Date End Date Taking? Authorizing Provider  doxycycline (VIBRAMYCIN) 100 MG capsule Take 1 capsule (100 mg total) by mouth 2 (two) times daily for 10 days. 03/07/18 03/17/18  Wieters, Junius Creamer, PA-C    Family History History reviewed. No pertinent family history.  Social History Social History   Tobacco Use  . Smoking status: Never Smoker  . Smokeless tobacco: Never Used  Substance Use Topics  . Alcohol use: No  . Drug use: Not on file     Allergies   Patient has no known allergies.   Review of Systems Review of Systems  Constitutional: Negative for fatigue and fever.  HENT: Negative for facial swelling and mouth sores.   Eyes: Negative for visual disturbance.  Respiratory: Negative for shortness of breath.   Cardiovascular: Negative for chest  pain.  Gastrointestinal: Negative for abdominal pain, nausea and vomiting.  Genitourinary: Negative for genital sores.  Musculoskeletal: Negative for arthralgias and joint swelling.  Skin: Positive for color change and rash. Negative for wound.  Neurological: Negative for dizziness, weakness, light-headedness and headaches.     Physical Exam Triage Vital Signs ED Triage Vitals  Enc Vitals Group     BP 03/07/18 1443 113/67     Pulse Rate 03/07/18 1443 63     Resp --      Temp 03/07/18 1443 98.6 F (37 C)     Temp Source 03/07/18 1443 Oral     SpO2 03/07/18 1443 100 %     Weight --      Height --      Head Circumference --      Peak Flow --      Pain Score 03/07/18 1440 4     Pain Loc --      Pain Edu? --      Excl. in GC? --    No data found.  Updated Vital Signs BP 113/67 (BP Location: Left Arm)   Pulse 63   Temp 98.6 F (37 C) (Oral)   LMP 02/19/2018 (Approximate)   SpO2 100%   Visual Acuity Right Eye Distance:   Left Eye Distance:   Bilateral Distance:    Right Eye Near:   Left  Eye Near:    Bilateral Near:     Physical Exam  Constitutional: She is oriented to person, place, and time. She appears well-developed and well-nourished.  No acute distress  HENT:  Head: Normocephalic and atraumatic.  Nose: Nose normal.  Eyes: Conjunctivae are normal.  Neck: Neck supple.  Full active range of motion of neck, no neck swelling or erythema  Cardiovascular: Normal rate.  Pulmonary/Chest: Effort normal. No respiratory distress.  Abdominal: She exhibits no distension.  Musculoskeletal: Normal range of motion.  Neurological: She is alert and oriented to person, place, and time.  Skin: Skin is warm and dry.  1 cm indurated acne-like lesion to right maxilla area, comes to a white central head lesion.  Not actively drainage.  No fluctuance.  Psychiatric: She has a normal mood and affect.  Nursing note and vitals reviewed.    UC Treatments / Results  Labs (all  labs ordered are listed, but only abnormal results are displayed) Labs Reviewed  CBC    EKG None  Radiology No results found.  Procedures Procedures (including critical care time)  Medications Ordered in UC Medications - No data to display  Initial Impression / Assessment and Plan / UC Course  I have reviewed the triage vital signs and the nursing notes.  Pertinent labs & imaging results that were available during my care of the patient were reviewed by me and considered in my medical decision making (see chart for details).     Patient with early abscess versus cellulitis versus infected pimple to right face.  Will begin patient on doxycycline as this is helped her in the past.  Warm compresses.  Does not seem to warrant drainage at this time.  Will obtain CBC to check WBC, will call patient if abnormal.Discussed strict return precautions. Patient verbalized understanding and is agreeable with plan.  Final Clinical Impressions(s) / UC Diagnoses   Final diagnoses:  Cellulitis of face     Discharge Instructions     Please begin taking doxycycline twice daily for the next 10 days Please apply warm compresses to this area to help soften up tissue We will call you if the blood test comes back abnormal  In the meantime please continue to monitor your symptoms, follow-up in 3 to 4 days if symptoms persisting or worsening, return sooner if developing significant worsening, drainage, pain or swelling to the face, or neck, or spreading close to eye   ED Prescriptions    Medication Sig Dispense Auth. Provider   doxycycline (VIBRAMYCIN) 100 MG capsule Take 1 capsule (100 mg total) by mouth 2 (two) times daily for 10 days. 20 capsule Wieters, Hallie C, PA-C     Controlled Substance Prescriptions [place] Controlled Substance Registry consulted? Not Applicable   Lew Dawes, [place] 03/07/18 1459

## 2018-03-07 NOTE — Discharge Instructions (Signed)
Please begin taking doxycycline twice daily for the next 10 days Please apply warm compresses to this area to help soften up tissue We will call you if the blood test comes back abnormal  In the meantime please continue to monitor your symptoms, follow-up in 3 to 4 days if symptoms persisting or worsening, return sooner if developing significant worsening, drainage, pain or swelling to the face, or neck, or spreading close to eye

## 2018-03-07 NOTE — ED Triage Notes (Addendum)
Pt has been having recurrent issues with cellulitis to her face.  Over a year ago she was hospitalized for the same issue.  Pt is hoping to avoid that today with early antibiotic treatment.  Pt reports that both times this has occurred it has occurred just after returning from [place].

## 2023-02-13 LAB — OB RESULTS CONSOLE GC/CHLAMYDIA
Chlamydia: NEGATIVE
Neisseria Gonorrhea: NEGATIVE

## 2023-02-13 LAB — OB RESULTS CONSOLE ANTIBODY SCREEN: Antibody Screen: NEGATIVE

## 2023-02-13 LAB — OB RESULTS CONSOLE HEPATITIS B SURFACE ANTIGEN: Hepatitis B Surface Ag: NEGATIVE

## 2023-02-13 LAB — OB RESULTS CONSOLE HIV ANTIBODY (ROUTINE TESTING): HIV: NONREACTIVE

## 2023-02-13 LAB — OB RESULTS CONSOLE RPR: RPR: NONREACTIVE

## 2023-02-13 LAB — OB RESULTS CONSOLE RUBELLA ANTIBODY, IGM: Rubella: IMMUNE

## 2023-02-13 LAB — HEPATITIS C ANTIBODY: HCV Ab: NEGATIVE

## 2023-02-25 ENCOUNTER — Encounter (HOSPITAL_COMMUNITY): Payer: Self-pay | Admitting: Obstetrics and Gynecology

## 2023-02-25 ENCOUNTER — Inpatient Hospital Stay (HOSPITAL_COMMUNITY)
Admission: AD | Admit: 2023-02-25 | Discharge: 2023-02-25 | Disposition: A | Discharge status: Home / Self Care | Payer: Medicaid Other | Attending: Obstetrics and Gynecology | Admitting: Obstetrics and Gynecology

## 2023-02-25 DIAGNOSIS — Z3A09 9 weeks gestation of pregnancy: Secondary | ICD-10-CM | POA: Insufficient documentation

## 2023-02-25 DIAGNOSIS — O219 Vomiting of pregnancy, unspecified: Secondary | ICD-10-CM | POA: Diagnosis not present

## 2023-02-25 DIAGNOSIS — R5383 Other fatigue: Secondary | ICD-10-CM | POA: Diagnosis present

## 2023-02-25 DIAGNOSIS — O210 Mild hyperemesis gravidarum: Secondary | ICD-10-CM | POA: Diagnosis not present

## 2023-02-25 DIAGNOSIS — R110 Nausea: Secondary | ICD-10-CM | POA: Diagnosis present

## 2023-02-25 LAB — URINALYSIS, ROUTINE W REFLEX MICROSCOPIC
Bilirubin Urine: NEGATIVE
Glucose, UA: NEGATIVE mg/dL
Hgb urine dipstick: NEGATIVE
Ketones, ur: NEGATIVE mg/dL
Leukocytes,Ua: NEGATIVE
Nitrite: NEGATIVE
Protein, ur: NEGATIVE mg/dL
Specific Gravity, Urine: 1.021 (ref 1.005–1.030)
pH: 6 (ref 5.0–8.0)

## 2023-02-25 MED ORDER — METOCLOPRAMIDE HCL 10 MG PO TABS: 10.0000 mg | ORAL_TABLET | Freq: Four times a day (QID) | ORAL | 0 refills | Status: DC

## 2023-02-25 NOTE — MAU Note (Addendum)
.  Katelyn Peck is a 32 y.o. at Unknown here in MAU reporting: Nausea and gagging for the past week as well as extreme fatigue. She reports her symptoms are worse in the mornings when she brushes her teeth. Nausea triggered by food and smells. Gets her care with [facility]. Rx Diclegis and reports she is still nauseated. Tried to eat this morning but had no appetite. Not currently nauseated.  Last took Diclegis yesterday morning. Reports she can take it four times a day but only does once because it has not worked.  Next OB appt 10/9.  LMP: 12/24/2022 Onset of complaint: One week Pain score: Denies pain.  Lab orders placed from triage: UA

## 2023-02-25 NOTE — MAU Provider Note (Signed)
History     CSN: [id number]  Arrival date and time: 02/25/23 1029   Event Date/Time   First Provider Initiated Contact with Patient 02/25/23 1155      Chief Complaint  Patient presents with   Nausea   Fatigue   Katelyn Peck is a 32 y.o. G1P0 at [redacted]w[redacted]d who receives care at [facility] in [place].  Patient reports next appt Oct 9th.  She presents today for nausea.  She reports she has no energy and unable to consume food.  She is taking a medication for nausea (name unknown) and reports last dose was yesterday morning. She reports she can take it 4x/day. Patient states she is having vomiting up to 3x/day.  Patient denies current nausea or recent vomiting.    OB History     Gravida  1   Para      Term      Preterm      AB      Living         SAB      IAB      Ectopic      Multiple      Live Births              No past medical history on file.  No past surgical history on file.  No family history on file.  Social History   Tobacco Use   Smoking status: Never   Smokeless tobacco: Never  Substance Use Topics   Alcohol use: No    Allergies: No Known Allergies  No medications prior to admission.    Review of Systems  Gastrointestinal:  Positive for nausea (None currently). Negative for abdominal pain.   Physical Exam   Blood pressure (!) 108/59, pulse 91, temperature 98 F (36.7 C), temperature source Oral, resp. rate 16, height 5' (1.524 m), weight 50.1 kg, SpO2 99%.  Physical Exam Vitals reviewed.  Constitutional:      Appearance: Normal appearance.  HENT:     Head: Normocephalic and atraumatic.     Mouth/Throat:     Mouth: Mucous membranes are moist.  Eyes:     Conjunctiva/sclera: Conjunctivae normal.  Cardiovascular:     Rate and Rhythm: Normal rate.  Pulmonary:     Effort: Pulmonary effort is normal. No respiratory distress.  Musculoskeletal:        General: Normal range of motion.     Cervical back: Normal range of motion.   Neurological:     Mental Status: She is alert and oriented to person, place, and time.  Psychiatric:        Mood and Affect: Mood normal.        Behavior: Behavior normal.     MAU Course  Procedures Results for orders placed or performed during the hospital encounter of 02/25/23 (from the past 24 hour(s))  Urinalysis, Routine w reflex microscopic -Urine, Clean Catch     Status: None   Collection Time: 02/25/23 10:56 AM  Result Value Ref Range   Color, Urine YELLOW YELLOW   APPearance CLEAR CLEAR   Specific Gravity, Urine 1.021 1.005 - 1.030   pH 6.0 5.0 - 8.0   Glucose, UA NEGATIVE NEGATIVE mg/dL   Hgb urine dipstick NEGATIVE NEGATIVE   Bilirubin Urine NEGATIVE NEGATIVE   Ketones, ur NEGATIVE NEGATIVE mg/dL   Protein, ur NEGATIVE NEGATIVE mg/dL   Nitrite NEGATIVE NEGATIVE   Leukocytes,Ua NEGATIVE NEGATIVE    MDM Labs: UA Prescription Assessment and Plan  32 year old, G1P0  SIUP at 9.0 weeks Morning Sickness  -Reviewed visits from primary ob. -Medications show patient prescribed Diclegis.  -Reviewed proper administration of Diclegis to include daily usage with 2 pills at night and increase during day as needed. -Discussed and agreed to PRN medication (Reglan) for break through nausea or when trying to avoid potential drowsiness.  -Reassured that nausea and vomiting in pregnancy is anticipated complaint. -Informed that fluids are not usually required for vomiting 3-6x/day. -Further informed to return when vomiting >/=10x/day or >/= 3x/hr. -Reviewed POC with patient. -Informed that if urine returns showing signs of dehydration will proceed with IV hydration. -Otherwise will discharge. -Patient and SO verbalizes understanding.    Cherre Robins 02/25/2023, 11:55 AM   Reassessment (12:35 PM) -UA returns unremarkable. -Rx for reglan sent to pharmacy on file.  -Nurse to give precautions. -Encouraged to call primary office or return to MAU if symptoms worsen or with  the onset of new symptoms. -Discharged to home in stable condition.  Cherre Robins MSN, CNM Advanced Practice Provider, [facility] [employer]

## 2023-02-25 NOTE — MAU Note (Signed)
Called to check on status of urine. Lab did not answer.

## 2023-04-17 LAB — OB RESULTS CONSOLE RUBELLA ANTIBODY, IGM: Rubella: IMMUNE

## 2023-06-05 NOTE — L&D Delivery Note (Signed)
 Delivery Note    Patient Name: Katelyn Peck DOB: April 21, 1991 MRN: [id number]  Date of admission: 09/28/2023 Delivering MD: Editha Goring Date of delivery: 09/29/2023 Type of delivery: SVD  Newborn Data: Live born female  Birth Weight:   APGAR: 8, 9  Newborn Delivery   Birth date/time: 09/28/2023 12:31:00 Delivery type: Vaginal, Spontaneous      [name], 33 y.o., @ [redacted]w[redacted]d,  G1P0, who was admitted for IOL for GHTN. I was called to the room when she progressed 1+-2+ station in the second stage of labor.  She pushed for 4hours.  She delivered a viable infant, cephalic and restituted to the LOA with right compound hand position over an 3rd degree lacerated perineum.  A nuchal cord   was identified, loose and reduced. The baby was placed on maternal abdomen while initial step of NRP were perfmored (Dry, Stimulated, and warmed). Hat placed on baby for thermoregulation. Delayed cord clamping was performed for 2 minutes.  Cord double clamped and cut.  Cord cut by FOB. Apgar scores were 8 and 9. Prophylactic Pitocin was started in the third stage of labor for active management. The placenta delivered spontaneously, shultz, with a 3 vessel cord and was sent to LD.  There was some uterine atony noted post delivery TXA and IM methergine were ordered, pt BP was normotensive. Inspection revealed 3rd degree with DR Wynona Hedger present for the repair.. An examination of the vaginal vault and cervix was free from lacerations. The uterus was firm, bleeding stable.  The repair was done under epidural.   Placenta and umbilical artery blood gas were not sent.  There were no complications during the procedure.  Mom and baby skin to skin following delivery. Left in stable condition.  Maternal Info: Anesthesia:Epidural Episiotomy: no Lacerations:  3rd Suture Repair: rectal muscle repaired with crown stitch o moycl, and vycryl 3.0 and CT-1 and SH for 2nd . Dr Wynona Hedger present for repair  Est. Blood Loss (mL):    Newborn Info:  Baby Sex: female Circumcision: in pt  APGAR (1 MIN): 8  APGAR (5 MINS): 9  APGAR (10 MINS):     Mom to postpartum.  Baby to Couplet care / Skin to Skin.  Delivery Report:    Review the Delivery Report for details.   Paityn Balsam  CNM, FNP-C, PMHNP-BC  [id number]  [place], [place] [address]  Cell: [phone]  Office Phone: [phone] Fax: [phone] 09/29/2023  1:33 PM

## 2023-09-26 ENCOUNTER — Other Ambulatory Visit: Payer: Self-pay | Admitting: Obstetrics and Gynecology

## 2023-09-27 ENCOUNTER — Telehealth (HOSPITAL_COMMUNITY): Payer: Self-pay | Admitting: *Deleted

## 2023-09-27 NOTE — Telephone Encounter (Signed)
 Preadmission screen

## 2023-09-28 ENCOUNTER — Inpatient Hospital Stay (HOSPITAL_COMMUNITY): Admitting: Anesthesiology

## 2023-09-28 ENCOUNTER — Inpatient Hospital Stay (HOSPITAL_COMMUNITY)
Admission: AD | Admit: 2023-09-28 | Discharge: 2023-10-01 | DRG: 768 | Disposition: A | Discharge status: Home / Self Care | Attending: Obstetrics and Gynecology | Admitting: Obstetrics and Gynecology

## 2023-09-28 ENCOUNTER — Other Ambulatory Visit: Payer: Self-pay

## 2023-09-28 ENCOUNTER — Encounter (HOSPITAL_COMMUNITY): Payer: Self-pay | Admitting: Obstetrics and Gynecology

## 2023-09-28 DIAGNOSIS — K219 Gastro-esophageal reflux disease without esophagitis: Secondary | ICD-10-CM | POA: Diagnosis present

## 2023-09-28 DIAGNOSIS — O139 Gestational [pregnancy-induced] hypertension without significant proteinuria, unspecified trimester: Secondary | ICD-10-CM | POA: Diagnosis not present

## 2023-09-28 DIAGNOSIS — Z3A39 39 weeks gestation of pregnancy: Secondary | ICD-10-CM

## 2023-09-28 DIAGNOSIS — O9962 Diseases of the digestive system complicating childbirth: Secondary | ICD-10-CM | POA: Diagnosis present

## 2023-09-28 DIAGNOSIS — O99824 Streptococcus B carrier state complicating childbirth: Secondary | ICD-10-CM | POA: Diagnosis present

## 2023-09-28 DIAGNOSIS — O26893 Other specified pregnancy related conditions, third trimester: Secondary | ICD-10-CM | POA: Diagnosis present

## 2023-09-28 DIAGNOSIS — O134 Gestational [pregnancy-induced] hypertension without significant proteinuria, complicating childbirth: Secondary | ICD-10-CM | POA: Diagnosis present

## 2023-09-28 DIAGNOSIS — O4292 Full-term premature rupture of membranes, unspecified as to length of time between rupture and onset of labor: Principal | ICD-10-CM | POA: Diagnosis present

## 2023-09-28 DIAGNOSIS — O480 Post-term pregnancy: Secondary | ICD-10-CM | POA: Diagnosis present

## 2023-09-28 HISTORY — DX: Other specified health status: Z78.9

## 2023-09-28 LAB — COMPREHENSIVE METABOLIC PANEL WITH GFR
ALT: 18 U/L (ref 0–44)
AST: 27 U/L (ref 15–41)
Albumin: 2.9 g/dL — ABNORMAL LOW (ref 3.5–5.0)
Alkaline Phosphatase: 174 U/L — ABNORMAL HIGH (ref 38–126)
Anion gap: 11 (ref 5–15)
BUN: 7 mg/dL (ref 6–20)
CO2: 19 mmol/L — ABNORMAL LOW (ref 22–32)
Calcium: 8.9 mg/dL (ref 8.9–10.3)
Chloride: 105 mmol/L (ref 98–111)
Creatinine, Ser: 0.64 mg/dL (ref 0.44–1.00)
GFR, Estimated: >60 mL/min (ref >60)
Glucose, Bld: 87 mg/dL (ref 70–99)
Potassium: 3.8 mmol/L (ref 3.5–5.1)
Sodium: 135 mmol/L (ref 135–145)
Total Bilirubin: 0.6 mg/dL (ref 0.0–1.2)
Total Protein: 6.6 g/dL (ref 6.5–8.1)

## 2023-09-28 LAB — RPR: RPR Ser Ql: NONREACTIVE

## 2023-09-28 LAB — TYPE AND SCREEN
ABO/RH(D): O POS
Antibody Screen: NEGATIVE

## 2023-09-28 LAB — PROTEIN / CREATININE RATIO, URINE
Creatinine, Urine: 39 mg/dL
Total Protein, Urine: <6 mg/dL

## 2023-09-28 LAB — CBC
HCT: 40.6 % (ref 36.0–46.0)
Hemoglobin: 13.3 g/dL (ref 12.0–15.0)
MCH: 24.9 pg — ABNORMAL LOW (ref 26.0–34.0)
MCHC: 32.8 g/dL (ref 30.0–36.0)
MCV: 76 fL — ABNORMAL LOW (ref 80.0–100.0)
Platelets: 227 10*3/uL (ref 150–400)
RBC: 5.34 MIL/uL — ABNORMAL HIGH (ref 3.87–5.11)
RDW: 16.7 % — ABNORMAL HIGH (ref 11.5–15.5)
WBC: 14 10*3/uL — ABNORMAL HIGH (ref 4.0–10.5)
nRBC: 0 % (ref 0.0–0.2)

## 2023-09-28 LAB — POCT FERN TEST: POCT Fern Test: POSITIVE

## 2023-09-28 MED ORDER — LACTATED RINGERS IV SOLN: 500.0000 mL | INTRAVENOUS | Status: DC | PRN

## 2023-09-28 MED ORDER — SOD CITRATE-CITRIC ACID 500-334 MG/5ML PO SOLN: 30.0000 mL | ORAL | Status: DC | PRN

## 2023-09-28 MED ORDER — PENICILLIN G POT IN DEXTROSE 60000 UNIT/ML IV SOLN
3.0000 10*6.[IU] | INTRAVENOUS | Status: DC
  Administered 2023-09-28 – 2023-09-29 (×8): 3 10*6.[IU] via INTRAVENOUS
  Filled 2023-09-28 (×10): qty 50

## 2023-09-28 MED ORDER — LACTATED RINGERS IV SOLN
500.0000 mL | Freq: Once | INTRAVENOUS | Status: AC
  Administered 2023-09-28: 500 mL via INTRAVENOUS

## 2023-09-28 MED ORDER — DIPHENHYDRAMINE HCL 50 MG/ML IJ SOLN: 12.5000 mg | INTRAMUSCULAR | Status: DC | PRN

## 2023-09-28 MED ORDER — LIDOCAINE HCL (PF) 1 % IJ SOLN: 30.0000 mL | INTRAMUSCULAR | Status: DC | PRN

## 2023-09-28 MED ORDER — LIDOCAINE HCL (PF) 1 % IJ SOLN
INTRAMUSCULAR | Status: DC | PRN
  Administered 2023-09-28: 4 mL via EPIDURAL
  Administered 2023-09-28: 5 mL via EPIDURAL

## 2023-09-28 MED ORDER — PHENYLEPHRINE 80 MCG/ML (10ML) SYRINGE FOR IV PUSH (FOR BLOOD PRESSURE SUPPORT): 80.0000 ug | PREFILLED_SYRINGE | INTRAVENOUS | Status: DC | PRN

## 2023-09-28 MED ORDER — EPHEDRINE 5 MG/ML INJ: 10.0000 mg | INTRAVENOUS | Status: DC | PRN

## 2023-09-28 MED ORDER — SODIUM CHLORIDE 0.9 % IV SOLN
5.0000 10*6.[IU] | Freq: Once | INTRAVENOUS | Status: AC
  Administered 2023-09-28: 5 10*6.[IU] via INTRAVENOUS
  Filled 2023-09-28: qty 5

## 2023-09-28 MED ORDER — OXYTOCIN-SODIUM CHLORIDE 30-0.9 UT/500ML-% IV SOLN: 2.5000 [IU]/h | INTRAVENOUS | Status: DC

## 2023-09-28 MED ORDER — ACETAMINOPHEN 325 MG PO TABS: 650.0000 mg | ORAL_TABLET | ORAL | Status: DC | PRN

## 2023-09-28 MED ORDER — TERBUTALINE SULFATE 1 MG/ML IJ SOLN: 0.2500 mg | Freq: Once | INTRAMUSCULAR | Status: DC | PRN

## 2023-09-28 MED ORDER — FENTANYL-BUPIVACAINE-NACL 0.5-0.125-0.9 MG/250ML-% EP SOLN
12.0000 mL/h | EPIDURAL | Status: DC | PRN
  Administered 2023-09-28: 11 mL/h via EPIDURAL
  Filled 2023-09-28: qty 250

## 2023-09-28 MED ORDER — FLEET ENEMA RE ENEM: 1.0000 | ENEMA | RECTAL | Status: DC | PRN

## 2023-09-28 MED ORDER — LACTATED RINGERS IV SOLN: INTRAVENOUS | Status: DC

## 2023-09-28 MED ORDER — OXYTOCIN-SODIUM CHLORIDE 30-0.9 UT/500ML-% IV SOLN
1.0000 m[IU]/min | INTRAVENOUS | Status: DC
  Administered 2023-09-28: 1 m[IU]/min via INTRAVENOUS

## 2023-09-28 MED ORDER — FENTANYL CITRATE (PF) 100 MCG/2ML IJ SOLN
50.0000 ug | INTRAMUSCULAR | Status: DC | PRN
  Administered 2023-09-28: 100 ug via INTRAVENOUS
  Filled 2023-09-28: qty 2

## 2023-09-28 MED ORDER — ONDANSETRON HCL 4 MG/2ML IJ SOLN: 4.0000 mg | Freq: Four times a day (QID) | INTRAMUSCULAR | Status: DC | PRN

## 2023-09-28 MED ORDER — OXYCODONE-ACETAMINOPHEN 5-325 MG PO TABS: 2.0000 | ORAL_TABLET | ORAL | Status: DC | PRN

## 2023-09-28 MED ORDER — OXYTOCIN BOLUS FROM INFUSION
333.0000 mL | Freq: Once | INTRAVENOUS | Status: AC
Start: 2023-09-28 — End: 2023-09-29
  Administered 2023-09-29: 333 mL via INTRAVENOUS

## 2023-09-28 MED ORDER — OXYTOCIN BOLUS FROM INFUSION: 333.0000 mL | Freq: Once | INTRAVENOUS | Status: DC

## 2023-09-28 MED ORDER — OXYTOCIN-SODIUM CHLORIDE 30-0.9 UT/500ML-% IV SOLN
2.5000 [IU]/h | INTRAVENOUS | Status: DC
  Administered 2023-09-29: 2.5 [IU]/h via INTRAVENOUS
  Filled 2023-09-28: qty 500

## 2023-09-28 MED ORDER — OXYCODONE-ACETAMINOPHEN 5-325 MG PO TABS: 1.0000 | ORAL_TABLET | ORAL | Status: DC | PRN

## 2023-09-28 MED ORDER — LACTATED RINGERS IV SOLN
500.0000 mL | INTRAVENOUS | Status: DC | PRN
  Administered 2023-09-28: 500 mL via INTRAVENOUS
  Administered 2023-09-29: 125 mL via INTRAVENOUS

## 2023-09-28 NOTE — Anesthesia Procedure Notes (Signed)
 Epidural Patient location during procedure: OB Start time: 09/28/2023 10:29 PM End time: 09/28/2023 10:38 PM  Staffing Anesthesiologist: Tura Gaines, MD Performed: anesthesiologist   Preanesthetic Checklist Completed: patient identified, IV checked, site marked, risks and benefits discussed, surgical consent, monitors and equipment checked, pre-op evaluation and timeout performed  Epidural Patient position: sitting Prep: DuraPrep and site prepped and draped Patient monitoring: continuous pulse ox and blood pressure Approach: midline Location: L3-L4 Injection technique: LOR air  Needle:  Needle type: Tuohy  Needle gauge: 17 G Needle length: 9 cm and 9 Needle insertion depth: 5 cm Catheter type: closed end flexible Catheter size: 19 Gauge Catheter at skin depth: 10 cm Test dose: negative and Other  Assessment Events: blood not aspirated, no cerebrospinal fluid, injection not painful, no injection resistance, no paresthesia and negative IV test  Additional Notes Patient identified. Risks and benefits discussed including failed block, incomplete  Pain control, post dural puncture headache, nerve damage, paralysis, blood pressure Changes, nausea, vomiting, reactions to medications-both toxic and allergic and post Partum back pain. All questions were answered. Patient expressed understanding and wished to proceed. Sterile technique was used throughout procedure. Epidural site was Dressed with sterile barrier dressing. No paresthesias, signs of intravascular injection Or signs of intrathecal spread were encountered.  Patient was more comfortable after the epidural was dosed. Please see RN's note for documentation of vital signs and FHR which are stable. Reason for block:procedure for pain

## 2023-09-28 NOTE — Progress Notes (Signed)
 Pumped breast for 5 minutes

## 2023-09-28 NOTE — H&P (Signed)
 Katelyn Peck is a 33 y.o. female G1P0 at 80 weeks and 5 days  presenting for labor evaluation. Found to be Ruptured with positive fern testing.  Contractions are irregular. ROM thought to have occurred yesterday morning as patient reports gushes of fluid throughout the day however she did not realize she was ruptured.  +FM. No vaginal bleeding.   Prenatal care provided by [facility].  OB History     Gravida  1   Para      Term      Preterm      AB      Living         SAB      IAB      Ectopic      Multiple      Live Births             Past Medical History:  Diagnosis Date   Medical history non-contributory    History reviewed. No pertinent surgical history. Family History: family history is not on file. Social History:  reports that she has never smoked. She has never used smokeless tobacco. She reports that she does not drink alcohol and does not use drugs.     Maternal Diabetes: No Genetic Screening: unknown Maternal Ultrasounds/Referrals: Normal Fetal Ultrasounds or other Referrals:  None Maternal Substance Abuse:  No Significant Maternal Medications:  None Significant Maternal Lab Results:  Group B Strep positive Number of Prenatal Visits:greater than 3 verified prenatal visits Maternal Vaccinations:unknown Other Comments:  None  Review of Systems  Constitutional: Negative.   HENT: Negative.    Eyes: Negative.   Respiratory: Negative.    Cardiovascular: Negative.   Gastrointestinal: Negative.   Endocrine: Negative.   Genitourinary: Negative.   Musculoskeletal: Negative.   Skin: Negative.   Allergic/Immunologic: Negative.   Neurological: Negative.   Hematological: Negative.   Psychiatric/Behavioral: Negative.     History Dilation: 2 Effacement (%): 70 Station: -2, -1 Exam by:: A English as a second language teacher Blood pressure 128/86, pulse 87, temperature 97.8 F (36.6 C), temperature source Oral, resp. rate 14, height 5'  (1.524 m), weight 68 kg. Maternal Exam:  Introitus: Normal vulva.   Physical Exam Vitals reviewed.  Constitutional:      Appearance: Normal appearance.  HENT:     Head: Normocephalic and atraumatic.     Nose: Nose normal.  Cardiovascular:     Rate and Rhythm: Normal rate and regular rhythm.     Pulses: Normal pulses.  Pulmonary:     Effort: Pulmonary effort is normal.     Breath sounds: Normal breath sounds.  Abdominal:     Tenderness: There is no abdominal tenderness.  Genitourinary:    General: Normal vulva.  Musculoskeletal:        General: Normal range of motion.     Cervical back: Normal range of motion and neck supple.  Skin:    General: Skin is warm.  Neurological:     General: No focal deficit present.     Mental Status: She is alert and oriented to person, place, and time.  Psychiatric:        Mood and Affect: Mood normal.        Behavior: Behavior normal.    Cervix at 620 am 3.5/70/-2 Prenatal labs: ABO, Rh:   Antibody: Negative (09/11 0000) Rubella: Immune (11/13 0000) RPR: Nonreactive (09/11 0000)  HBsAg: Negative (09/11 0000)  HIV: Non-reactive (09/11 0000)  GBS:  Positive    Assessment/Plan: 39  weeks and 5 days with PROM - admit to labor and deliver .  - Start Penicillin for GBS prophylaxis.  - Fetal Well being Category 1 - Pain control per patient preference.    Arlee Lace 09/28/2023, 3:46 AM

## 2023-09-28 NOTE — Progress Notes (Signed)
 Pt standing at bedside.  Assited to bed on left side

## 2023-09-28 NOTE — Progress Notes (Signed)
 Pt pumping breast bilaterally with electric breast pump

## 2023-09-28 NOTE — Progress Notes (Signed)
On birthing ball

## 2023-09-28 NOTE — Progress Notes (Signed)
  Subjective: Patient rates contractions as 9-10 out of 10. Continued LOF no vaginal bleeding. +FM.   Objective: BP 126/79   Pulse 88   Temp 97.9 F (36.6 C) (Oral)   Resp 17   Ht 5' (1.524 m)   Wt 68 kg   LMP  (Exact Date)   SpO2 100%   BMI 29.28 kg/m  No intake/output data recorded. No intake/output data recorded.  FHT:  FHR: 130 bpm, variability: moderate,  accelerations:  Present,  decelerations:  Absent UC:   regular, every 2-3 minutes SVE:   Dilation: 5 Effacement (%): 80 Station: 0 Exam by:: Dr Wynona Hedger  Labs: Lab Results  Component Value Date   WBC 14.0 (H) 09/28/2023   HGB 13.3 09/28/2023   HCT 40.6 09/28/2023   MCV 76.0 (L) 09/28/2023   PLT 227 09/28/2023    Assessment / Plan: Protracted latent phase  Labor:  Plan to start pitocin 1x1 Preeclampsia:   NA Fetal Wellbeing:  Category I Pain Control:  Labor support without medications I/D:   Penicillin Anticipated MOD:  NSVD  Arlee Lace, MD 09/28/2023, 4:17 PM

## 2023-09-28 NOTE — Progress Notes (Signed)
 Pt told to stop pumping

## 2023-09-28 NOTE — Progress Notes (Signed)
 Pt will be walking in hallway

## 2023-09-28 NOTE — Progress Notes (Signed)
   Labor Progress Note  Katelyn Peck, 33 y.o., G1P0, with an IUP @ [redacted]w[redacted]d, presented for SROM and latent labor.   Subjective: Pt stable in room with doula and husband at bedside, pt not coping well with cxt, long talk about epidural and fears, pt ready and desires to get an epidural, discussed stopping pitocin to give her body a little break while and after getting epidural then will reassess.  Patient Active Problem List   Diagnosis Date Noted   Normal labor 09/28/2023   Post-dates pregnancy 09/28/2023   Gestational hypertension 09/28/2023   Cellulitis of face 09/22/2016   Objective: BP 124/83   Pulse 86   Temp 98.7 F (37.1 C) (Oral)   Resp 18   Ht 5' (1.524 m)   Wt 68 kg   LMP  (Exact Date)   SpO2 100%   BMI 29.28 kg/m  No intake/output data recorded. No intake/output data recorded. NST: FHR baseline 140 bpm, Variability: moderate, Accelerations:present, Decelerations:  Absent= Cat 1/Reactive CTX:  regular, every 1.5-3 minutes Uterus gravid, soft non tender, moderate to palpate with contractions.  SVE:  Dilation: 6.5 Effacement (%): 90 Station: 0 Exam by:: S Grindstaff RN Pitocin at 8 and turned off mUn/min  Assessment:  Katelyn Peck, 33 y.o., G1P0, with an IUP @ [redacted]w[redacted]d, presented for SROM and latent labor. Pt progressing in prodromal active labor.  Patient Active Problem List   Diagnosis Date Noted   Normal labor 09/28/2023   Post-dates pregnancy 09/28/2023   Gestational hypertension 09/28/2023   Cellulitis of face 09/22/2016   NICHD: Category 1  Membranes:  SROM , clear on 4/25 @ 11 no s/s of infection  Asynclitic position Yes  Induction:    Cytotec x No  Foley Bulb: No  Pitocin - 8 and turned off now   Pain management:               IV pain management: x IV fentanyl at 2047  Nitrous: no             Epidural placement:  Plans for placement now   GBS Positive  Abx: pCN  4/26 @ 0338, 0801, 1224, 1558, 2004   Plan: Continue labor plan Continuous  monitoring Rest Ambulate Frequent position changes to facilitate fetal rotation and descent. Will reassess with cervical exam at 4 hours or earlier if necessary Pitocin beak now for epidural placement. If cxt space can restart pitocin 2x2 Anticipate labor progression and vaginal delivery.   Fong Mccarry  CNM, FNP-C, PMHNP-BC  [address]  [place], [place] [address]  Cell: [phone]  Office Phone: [phone] Fax: [phone] 09/28/2023  10:18 PM

## 2023-09-28 NOTE — Progress Notes (Signed)
 Doula present in room

## 2023-09-28 NOTE — Anesthesia Preprocedure Evaluation (Signed)
 Anesthesia Evaluation  Patient identified by MRN, date of birth, ID band Patient awake    Reviewed: Allergy & Precautions, Patient's Chart, lab work & pertinent test results  Airway Mallampati: II       Dental no notable dental hx. (+) Teeth Intact, Dental Advisory Given   Pulmonary neg pulmonary ROS   Pulmonary exam normal breath sounds clear to auscultation       Cardiovascular hypertension, Normal cardiovascular exam Rhythm:Regular     Neuro/Psych negative neurological ROS  negative psych ROS   GI/Hepatic Neg liver ROS,GERD  ,,  Endo/Other  negative endocrine ROS    Renal/GU negative Renal ROS  negative genitourinary   Musculoskeletal negative musculoskeletal ROS (+)    Abdominal   Peds  Hematology negative hematology ROS (+)   Anesthesia Other Findings   Reproductive/Obstetrics (+) Pregnancy                             Anesthesia Physical Anesthesia Plan  ASA: 2  Anesthesia Plan: Epidural   Post-op Pain Management:    Induction:   PONV Risk Score and Plan: 2 and Treatment may vary due to age or medical condition  Airway Management Planned: Natural Airway  Additional Equipment: Fetal Monitoring and None  Intra-op Plan:   Post-operative Plan:   Informed Consent: I have reviewed the patients History and Physical, chart, labs and discussed the procedure including the risks, benefits and alternatives for the proposed anesthesia with the patient or authorized representative who has indicated his/her understanding and acceptance.     Dental advisory given  Plan Discussed with: Anesthesiologist  Anesthesia Plan Comments: (Patient identified. Risks/Benefits/Options discussed with patient including but not limited to bleeding, infection, nerve damage, paralysis, failed block, incomplete pain control, headache, blood pressure changes, nausea, vomiting, reactions to medication  both or allergic, itching and postpartum back pain. Confirmed with bedside nurse the patient's most recent platelet count. Confirmed with patient that they are not currently taking any anticoagulation, have any bleeding history or any family history of bleeding disorders. Patient expressed understanding and wished to proceed. All questions were answered. )       Anesthesia Quick Evaluation

## 2023-09-28 NOTE — MAU Note (Signed)
 Pt says bloody show. Feels UC's 4/10 PNC- [facility]- On Wed -  VE - closed -  Denies HSV GBS -positive

## 2023-09-29 ENCOUNTER — Encounter (HOSPITAL_COMMUNITY): Payer: Self-pay | Admitting: Obstetrics and Gynecology

## 2023-09-29 LAB — CBC
HCT: 40.2 % (ref 36.0–46.0)
Hemoglobin: 13.4 g/dL (ref 12.0–15.0)
MCH: 25.1 pg — ABNORMAL LOW (ref 26.0–34.0)
MCHC: 33.3 g/dL (ref 30.0–36.0)
MCV: 75.4 fL — ABNORMAL LOW (ref 80.0–100.0)
Platelets: 205 10*3/uL (ref 150–400)
RBC: 5.33 MIL/uL — ABNORMAL HIGH (ref 3.87–5.11)
RDW: 17 % — ABNORMAL HIGH (ref 11.5–15.5)
WBC: 21.4 10*3/uL — ABNORMAL HIGH (ref 4.0–10.5)
nRBC: 0 % (ref 0.0–0.2)

## 2023-09-29 MED ORDER — ZOLPIDEM TARTRATE 5 MG PO TABS: 5.0000 mg | ORAL_TABLET | Freq: Every evening | ORAL | Status: DC | PRN

## 2023-09-29 MED ORDER — SENNOSIDES-DOCUSATE SODIUM 8.6-50 MG PO TABS
2.0000 | ORAL_TABLET | Freq: Every day | ORAL | Status: DC
  Administered 2023-09-30 – 2023-10-01 (×2): 2 via ORAL
  Filled 2023-09-29 (×2): qty 2

## 2023-09-29 MED ORDER — SIMETHICONE 80 MG PO CHEW: 80.0000 mg | CHEWABLE_TABLET | ORAL | Status: DC | PRN

## 2023-09-29 MED ORDER — TETANUS-DIPHTH-ACELL PERTUSSIS 5-2.5-18.5 LF-MCG/0.5 IM SUSY
0.5000 mL | PREFILLED_SYRINGE | Freq: Once | INTRAMUSCULAR | Status: DC
Start: 2023-09-30 — End: 2023-10-01

## 2023-09-29 MED ORDER — BENZOCAINE-MENTHOL 20-0.5 % EX AERO
1.0000 | INHALATION_SPRAY | CUTANEOUS | Status: DC | PRN
  Administered 2023-09-30: 1 via TOPICAL
  Filled 2023-09-29 (×2): qty 56

## 2023-09-29 MED ORDER — METHYLERGONOVINE MALEATE 0.2 MG/ML IJ SOLN: 0.2000 mg | INTRAMUSCULAR | Status: DC

## 2023-09-29 MED ORDER — ACETAMINOPHEN 325 MG PO TABS: 650.0000 mg | ORAL_TABLET | ORAL | Status: DC | PRN

## 2023-09-29 MED ORDER — OXYCODONE HCL 5 MG PO TABS: 10.0000 mg | ORAL_TABLET | ORAL | Status: DC | PRN

## 2023-09-29 MED ORDER — IBUPROFEN 600 MG PO TABS
600.0000 mg | ORAL_TABLET | Freq: Four times a day (QID) | ORAL | Status: DC
  Administered 2023-09-29 – 2023-10-01 (×7): 600 mg via ORAL
  Filled 2023-09-29 (×8): qty 1

## 2023-09-29 MED ORDER — ONDANSETRON HCL 4 MG/2ML IJ SOLN: 4.0000 mg | INTRAMUSCULAR | Status: DC | PRN

## 2023-09-29 MED ORDER — ONDANSETRON HCL 4 MG PO TABS: 4.0000 mg | ORAL_TABLET | ORAL | Status: DC | PRN

## 2023-09-29 MED ORDER — TRANEXAMIC ACID-NACL 1000-0.7 MG/100ML-% IV SOLN: 1000.0000 mg | INTRAVENOUS | Status: DC

## 2023-09-29 MED ORDER — METHYLERGONOVINE MALEATE 0.2 MG/ML IJ SOLN
INTRAMUSCULAR | Status: AC
Start: 2023-09-29 — End: 2023-09-29
  Administered 2023-09-29: 0.2 mg
  Filled 2023-09-29: qty 1

## 2023-09-29 MED ORDER — COCONUT OIL OIL: 1.0000 | TOPICAL_OIL | Status: DC | PRN

## 2023-09-29 MED ORDER — DIPHENHYDRAMINE HCL 25 MG PO CAPS: 25.0000 mg | ORAL_CAPSULE | Freq: Four times a day (QID) | ORAL | Status: DC | PRN

## 2023-09-29 MED ORDER — TRANEXAMIC ACID-NACL 1000-0.7 MG/100ML-% IV SOLN
INTRAVENOUS | Status: AC
  Administered 2023-09-29: 1000 mg
  Filled 2023-09-29: qty 100

## 2023-09-29 MED ORDER — WITCH HAZEL-GLYCERIN EX PADS: 1.0000 | MEDICATED_PAD | CUTANEOUS | Status: DC | PRN

## 2023-09-29 MED ORDER — PRENATAL MULTIVITAMIN CH
1.0000 | ORAL_TABLET | Freq: Every day | ORAL | Status: DC
  Administered 2023-09-30 – 2023-10-01 (×2): 1 via ORAL
  Filled 2023-09-29 (×2): qty 1

## 2023-09-29 MED ORDER — DIBUCAINE (PERIANAL) 1 % EX OINT: 1.0000 | TOPICAL_OINTMENT | CUTANEOUS | Status: DC | PRN

## 2023-09-29 MED ORDER — OXYCODONE HCL 5 MG PO TABS: 5.0000 mg | ORAL_TABLET | ORAL | Status: DC | PRN

## 2023-09-29 NOTE — Progress Notes (Signed)
 CNM discussing shoulder dystocia, vacuum and cesarean section.  Pro's and Con's

## 2023-09-29 NOTE — Lactation Note (Signed)
 This note was copied from a baby's chart. Lactation Consultation Note  Patient Name: Katelyn Peck [id number] Date: 09/29/2023 Age:32 hours Reason for consult: Initial assessment;Primapara;1st time breastfeeding;Term  P1- MOB plans to offer both braest milk and formula because she wants infant to get used to formula for when she goes back to work. Per MOB, infant latches really well without causing her any pain or pinching. MOB reported that infant had just latched and then bottle fed 30 minutes ago. MOB reported that she took infant off of the breast after 5 minutes because she wanted him to have the formula as well. LC reviewed the difference between colostrum vs mature milk. LC encouraged MOB to allow infant to nurse for as long as he wants, then supplement as needed. LC offered to demonstrate hand expression so she knew there was colostrum in her breasts. MOB consented. LC was able to express multiple drops of colostrum from, the left breast with one expression. MOB felt more confident with latching infant after visualizing her milk. MOB denied having further questions or concerns. LC encouraged MOB to call for a latch assessment at some point tonight.  LC reviewed the first 24 hr birthday nap, day 2 cluster feeding, feeding infant on cue 8-12x in 24 hrs, not allowing infant to go over 3 hrs without a feeding, CDC milk storage guidelines, LC services handout and engorgement/breast care. LC encouraged Mob to call for further assistance as needed.  Maternal Data Has patient been taught Hand Expression?: Yes Does the patient have breastfeeding experience prior to this delivery?: No  Feeding Mother's Current Feeding Choice: Breast Milk and Formula Nipple Type: Slow - flow  Lactation Tools Discussed/Used Pump Education: Milk Storage  Interventions Interventions: Breast feeding basics reviewed;Hand express;Education;LC Services brochure  Discharge Discharge Education: Engorgement and breast  care;Warning signs for feeding baby Pump: DEBP;Personal (Her DEBP that she got from insurance is broken. She plans on getting another one.)  Consult Status Consult Status: Follow-up Date: 09/30/23 Follow-up type: In-patient    Katelyn Peck BS, IBCLC 09/29/2023, 6:25 PM

## 2023-09-29 NOTE — Plan of Care (Signed)
Pt demonstrated understanding 

## 2023-09-29 NOTE — Progress Notes (Signed)
   Labor Progress Note  Katelyn Peck, 33 y.o., G1P0, with an IUP @ [redacted]w[redacted]d, presented for SROM and latent labor. With new onset GHTN.   Subjective: Pt  pushing for last 3 hours total, IUPC was placed with verbal consent r/t unable to tell if cxt were strong enough but MVU are adequate, although pt just started pushing the the correct place and feeling it, not much descend had occurred, discuss potential for CPD or large Newborn suspect 7.5lbs, but father was 9 pound, pt having increased in maternal exhaustion, discussed option of cat 1 strip and continue to pushing versus vacuum with risk of shoulder dystocia or PCS, pt thinking about options, will inform Dr Wynona Hedger.  Patient Active Problem List   Diagnosis Date Noted   Normal labor 09/28/2023   Post-dates pregnancy 09/28/2023   Gestational hypertension 09/28/2023   Cellulitis of face 09/22/2016   Objective: BP 118/78   Pulse (!) 108   Temp 98.3 F (36.8 C) (Oral)   Resp 17   Ht 5' (1.524 m)   Wt 68 kg   LMP  (Exact Date)   SpO2 90%   BMI 29.28 kg/m  I/O last 3 completed shifts: In: -  Out: 800 [Urine:800] Total I/O In: -  Out: 325 [Urine:325] NST: FHR baseline 130 bpm, Variability: moderate, Accelerations:present, Decelerations:  Absent= Cat 1/Reactive CTX:  regular, every 2-3 minutes Uterus gravid, soft non tender, moderate to palpate with contractions.  SVE:  Dilation:9 Effacement (%): 100 Station: Plus 1 Exam by:: Cruzita Dopp RN Pitocin at 8  mUn/min IUPC placed with ease MVU 200  Assessment:  Katelyn Peck, 33 y.o., G1P0, G1P0, with an IUP @ [redacted]w[redacted]d, presented for SROM and latent labor. With new onset GHTN. Pt pushing for 3 hours  Patient Active Problem List   Diagnosis Date Noted   Normal labor 09/28/2023   Post-dates pregnancy 09/28/2023   Gestational hypertension 09/28/2023   Cellulitis of face 09/22/2016   NICHD: Category 1  Membranes:  SROM , clear on 4/25 @ 11 no s/s of infection  Asynclitic position Yes IUPC in  Place MVU 200  Induction:    Cytotec x No  Foley Bulb: No  Pitocin - 8   Pain management:               IV pain management: x IV fentanyl at 2047  Nitrous: no             Epidural placement:  Placed at 2241 on 4/26  GBS Positive  Abx: pCN  4/26 @ 0338, 0801, 1224, 1558, 2004, 4/27 @ 0021, 0438  GHTN: PCR undetected, new onset during her labor, other lab unremarkable, asymptomatic. Currently BP 120/55   Plan: Continue labor plan Continuous monitoring GHTN: Monitor BP, no mag indicated, repeat labs cbc and cmp for morning.  Frequent position changes to facilitate fetal rotation and descent. Pushing at complete currently  Continue pitocin 2x2 Questionable mode of delivery vacuumed vs PCS   DR Wynona Hedger to be updated with pt status.   Berniece Abid  CNM, FNP-C, PMHNP-BC  [address]  [place], [place] [address]  Cell: [phone]  Office Phone: [phone] Fax: [phone] 09/29/2023  11:09 AM

## 2023-09-29 NOTE — Progress Notes (Signed)
 J Montana  CNM palpating abdomen

## 2023-09-29 NOTE — Progress Notes (Signed)
Tug of war.

## 2023-09-29 NOTE — Progress Notes (Signed)
Squat bar

## 2023-09-29 NOTE — Progress Notes (Signed)
 Alternating 10 min squatting 10 min lithotomy

## 2023-09-29 NOTE — Progress Notes (Signed)
   Labor Progress Note  Katelyn Peck, 33 y.o., G1P0, with an IUP @ [redacted]w[redacted]d, presented for SROM and latent labor. With new onset GHTN.   Subjective: Pt resting quietly able to get sleep last night and comfortable with epidural pitocin was restarted at 0245 for spaced out cxt, currently on 8 and progressing in active labor at 9cm.  Patient Active Problem List   Diagnosis Date Noted   Normal labor 09/28/2023   Post-dates pregnancy 09/28/2023   Gestational hypertension 09/28/2023   Cellulitis of face 09/22/2016   Objective: BP (!) 120/55   Pulse 86   Temp (!) 97.5 F (36.4 C) (Oral)   Resp 20   Ht 5' (1.524 m)   Wt 68 kg   LMP  (Exact Date)   SpO2 100%   BMI 29.28 kg/m  I/O last 3 completed shifts: In: -  Out: 800 [Urine:800] No intake/output data recorded. NST: FHR baseline 130 bpm, Variability: moderate, Accelerations:present, Decelerations:  Absent= Cat 1/Reactive CTX:  regular, every 1.5-3 minutes Uterus gravid, soft non tender, moderate to palpate with contractions.  SVE:  Dilation:9 Effacement (%): 100 Station: Plus 1 Exam by:: Katelyn Dopp RN Pitocin at 8  mUn/min  Assessment:  Katelyn Peck, 33 y.o., G1P0, with an IUP @ [redacted]w[redacted]d, presented for SROM and latent labor. With new onset GHTN. Pt pushing at complete Patient Active Problem List   Diagnosis Date Noted   Normal labor 09/28/2023   Post-dates pregnancy 09/28/2023   Gestational hypertension 09/28/2023   Cellulitis of face 09/22/2016   NICHD: Category 1  Membranes:  SROM , clear on 4/25 @ 11 no s/s of infection  Asynclitic position Yes  Induction:    Cytotec x No  Foley Bulb: No  Pitocin - 8   Pain management:               IV pain management: x IV fentanyl at 2047  Nitrous: no             Epidural placement:  Placed at 2241 on 4/26  GBS Positive  Abx: pCN  4/26 @ 0338, 0801, 1224, 1558, 2004, 4/27 @ 0021, 0438  GHTN: PCR undetected, new onset during her labor, other lab unremarkable, asymptomatic.  Currently BP 120/55   Plan: Continue labor plan Continuous monitoring GHTN: Monitor BP, no mag indicated, repeat labs cbc and cmp for morning.  Frequent position changes to facilitate fetal rotation and descent. Pushing at complete currently  Continue pitocin 2x2 Anticipate labor progression and vaginal delivery.   Katelyn Peck  CNM, FNP-C, PMHNP-BC  [id number]  [place], [place] [address]  Cell: [phone]  Office Phone: [phone] Fax: [phone] 09/29/2023  8:23 AM

## 2023-09-29 NOTE — Lactation Note (Signed)
 This note was copied from a baby's chart. Lactation Consultation Note  Patient Name: Katelyn Peck [id number] Date: 09/29/2023 Age:32 hours Reason for consult: Follow-up assessment;Mother's request;Primapara;1st time breastfeeding;Term;Breastfeeding assistance  P1- MOB requested latching assistance. MOB requested latching infant in the cross cradle hold on the left breast. LC assisted positioning infant belly to belly and nose to nipple. LC demonstrated how to compress the breast and create a "shelf" for infant to latch onto. Infant latched well and had a strong rhythmic suck. LC demonstrated how to flange infant's lips. Infant nursed for 10 minutes and then MOB's father walked into the room, so she quickly unlatched infant to cover herself up. LC informed MOB that we could have her father wait until infant was done feeding. MOB insisted that she will formula feed infant for the rest of the feeding. LC swaddled infant while MOB dressed herself. LC encouraged MOB to call for further assistance as needed.  Maternal Data Has patient been taught Hand Expression?: Yes Does the patient have breastfeeding experience prior to this delivery?: No  Feeding Mother's Current Feeding Choice: Breast Milk and Formula Nipple Type: Slow - flow  LATCH Score Latch: Repeated attempts needed to sustain latch, nipple held in mouth throughout feeding, stimulation needed to elicit sucking reflex.  Audible Swallowing: A few with stimulation  Type of Nipple: Everted at rest and after stimulation  Comfort (Breast/Nipple): Soft / non-tender  Hold (Positioning): Assistance needed to correctly position infant at breast and maintain latch.  LATCH Score: 7   Lactation Tools Discussed/Used Pump Education: Milk Storage  Interventions Interventions: Breast feeding basics reviewed;Assisted with latch;Breast compression;Adjust position;Support pillows;Position options;Education;LC Services brochure  Discharge Discharge  Education: Engorgement and breast care;Warning signs for feeding baby Pump: DEBP;Personal  Consult Status Consult Status: Follow-up Date: 09/30/23 Follow-up type: In-patient    Vernette Goo BS, IBCLC 09/29/2023, 10:16 PM

## 2023-09-29 NOTE — Progress Notes (Signed)
   Labor Progress Note  Katelyn Peck, 33 y.o., G1P0, with an IUP @ [redacted]w[redacted]d, presented for SROM and latent labor.   Subjective: Pt resting quietly able to get sleep last night and comfortable with epidural pitocin was restarted at 0245 for spaced out cxt, currently on 8 and progressing in active labor at 9cm.  Patient Active Problem List   Diagnosis Date Noted   Normal labor 09/28/2023   Post-dates pregnancy 09/28/2023   Gestational hypertension 09/28/2023   Cellulitis of face 09/22/2016   Objective: BP 108/63   Pulse 88   Temp 98.5 F (36.9 C) (Oral)   Resp 18   Ht 5' (1.524 m)   Wt 68 kg   LMP  (Exact Date)   SpO2 100%   BMI 29.28 kg/m  No intake/output data recorded. No intake/output data recorded. NST: FHR baseline 130 bpm, Variability: moderate, Accelerations:present, Decelerations:  Absent= Cat 1/Reactive CTX:  regular, every 1.5-3 minutes Uterus gravid, soft non tender, moderate to palpate with contractions.  SVE:  Dilation:9 Effacement (%): 100 Station: Plus 1 Exam by:: Cruzita Dopp RN Pitocin at 8  mUn/min  Assessment:  Katelyn Peck, 33 y.o., G1P0, with an IUP @ [redacted]w[redacted]d, presented for SROM and latent labor. Pt progressing in active labor.  Patient Active Problem List   Diagnosis Date Noted   Normal labor 09/28/2023   Post-dates pregnancy 09/28/2023   Gestational hypertension 09/28/2023   Cellulitis of face 09/22/2016   NICHD: Category 1  Membranes:  SROM , clear on 4/25 @ 11 no s/s of infection  Asynclitic position Yes  Induction:    Cytotec x No  Foley Bulb: No  Pitocin - 8   Pain management:               IV pain management: x IV fentanyl at 2047  Nitrous: no             Epidural placement:  Placed at 2241 on 4/26  GBS Positive  Abx: pCN  4/26 @ 0338, 0801, 1224, 1558, 2004, 4/27 @ 0021, 1610   Plan: Continue labor plan Continuous monitoring Rest Ambulate Frequent position changes to facilitate fetal rotation and descent. Will reassess with  cervical exam at 4 hours or earlier if necessary Continue pitocin 2x2 Anticipate labor progression and vaginal delivery.   Katelyn Peck  CNM, FNP-C, PMHNP-BC  [address]  [place], [place] [address]  Cell: [phone]  Office Phone: [phone] Fax: [phone] 09/29/2023  6:09 AM

## 2023-09-29 NOTE — Progress Notes (Signed)
 Pt needs time without nurse in room.

## 2023-09-29 NOTE — Anesthesia Postprocedure Evaluation (Signed)
 Anesthesia Post Note  Patient: Katelyn Peck  Procedure(s) Performed: AN AD HOC LABOR EPIDURAL     Patient location during evaluation: Mother Baby Anesthesia Type: Epidural Level of consciousness: awake and alert Pain management: pain level controlled Vital Signs Assessment: post-procedure vital signs reviewed and stable Respiratory status: spontaneous breathing, nonlabored ventilation and respiratory function stable Cardiovascular status: stable Postop Assessment: no headache, no backache, epidural receding, no apparent nausea or vomiting, patient able to bend at knees, able to ambulate and adequate PO intake Anesthetic complications: no   No notable events documented.  Last Vitals:  Vitals:   09/29/23 1458 09/29/23 1641  BP: 137/87 130/82  Pulse: 99 98  Resp: 18 18  Temp: 36.8 C 36.7 C  SpO2:  96%    Last Pain:  Vitals:   09/29/23 1641  TempSrc: Oral  PainSc:    Pain Goal:                   Bethzy Hauck Hristova

## 2023-09-29 NOTE — Progress Notes (Signed)
 Resting for 30 minutes

## 2023-09-30 LAB — COMPREHENSIVE METABOLIC PANEL WITH GFR
ALT: 20 U/L (ref 0–44)
AST: 45 U/L — ABNORMAL HIGH (ref 15–41)
Albumin: 2.1 g/dL — ABNORMAL LOW (ref 3.5–5.0)
Alkaline Phosphatase: 126 U/L (ref 38–126)
Anion gap: 7 (ref 5–15)
BUN: 6 mg/dL (ref 6–20)
CO2: 18 mmol/L — ABNORMAL LOW (ref 22–32)
Calcium: 8.7 mg/dL — ABNORMAL LOW (ref 8.9–10.3)
Chloride: 110 mmol/L (ref 98–111)
Creatinine, Ser: 0.69 mg/dL (ref 0.44–1.00)
GFR, Estimated: >60 mL/min (ref >60)
Glucose, Bld: 76 mg/dL (ref 70–99)
Potassium: 3.4 mmol/L — ABNORMAL LOW (ref 3.5–5.1)
Sodium: 135 mmol/L (ref 135–145)
Total Bilirubin: 0.7 mg/dL (ref 0.0–1.2)
Total Protein: 4.9 g/dL — ABNORMAL LOW (ref 6.5–8.1)

## 2023-09-30 LAB — CBC
HCT: 34.5 % — ABNORMAL LOW (ref 36.0–46.0)
Hemoglobin: 11.3 g/dL — ABNORMAL LOW (ref 12.0–15.0)
MCH: 25 pg — ABNORMAL LOW (ref 26.0–34.0)
MCHC: 32.8 g/dL (ref 30.0–36.0)
MCV: 76.3 fL — ABNORMAL LOW (ref 80.0–100.0)
Platelets: 174 10*3/uL (ref 150–400)
RBC: 4.52 MIL/uL (ref 3.87–5.11)
RDW: 16.8 % — ABNORMAL HIGH (ref 11.5–15.5)
WBC: 15.7 10*3/uL — ABNORMAL HIGH (ref 4.0–10.5)
nRBC: 0 % (ref 0.0–0.2)

## 2023-09-30 NOTE — Progress Notes (Signed)
 Post Partum Day 1 Subjective: No complaints. She is ambulating and voiding without difficulty. No bowel movement yet. With perineal pain.   Objective: Blood pressure 103/73, pulse 70, temperature 98.1 F (36.7 C), temperature source Oral, resp. rate 18, height 5' (1.524 m), weight 68 kg, SpO2 99%, unknown if currently breastfeeding.  Physical Exam:  General: alert, cooperative, and no distress Lochia: appropriate Uterine Fundus: firm Incision: N/A DVT Evaluation: No evidence of DVT seen on physical exam. Calf/Ankle edema is present.  Recent Labs    09/29/23 1430 09/30/23 0454  HGB 13.4 11.3*  HCT 40.2 34.5*    Assessment/Plan: 33 y/o G1P1 PPD # 1, stable, S/p third degree laceration repair, discussed stool softeners,sitz baths.  Plan for discharge tomorrow, Lactation consult, Circumcision prior to discharge, and Contraception is unsure.    LOS: 2 days   Charlott Converse, MD 09/30/2023, 12:39 PM

## 2023-10-01 ENCOUNTER — Other Ambulatory Visit (HOSPITAL_COMMUNITY): Payer: Self-pay

## 2023-10-01 MED ORDER — WITCH HAZEL-GLYCERIN EX PADS
1.0000 | MEDICATED_PAD | CUTANEOUS | 12 refills | Status: AC | PRN
  Filled 2023-10-01: qty 40, fill #0

## 2023-10-01 MED ORDER — SENNOSIDES-DOCUSATE SODIUM 8.6-50 MG PO TABS
2.0000 | ORAL_TABLET | Freq: Every day | ORAL | 1 refills | Status: AC
  Filled 2023-10-01: qty 60, 30d supply, fill #0

## 2023-10-01 MED ORDER — BENZOCAINE-MENTHOL 20-0.5 % EX AERO
1.0000 | INHALATION_SPRAY | CUTANEOUS | 1 refills | Status: AC | PRN
  Filled 2023-10-01: qty 78, fill #0

## 2023-10-01 MED ORDER — DIBUCAINE (PERIANAL) 1 % EX OINT
1.0000 | TOPICAL_OINTMENT | CUTANEOUS | 1 refills | Status: AC | PRN
Start: 2023-10-01 — End: ?
  Filled 2023-10-01: qty 56, fill #0

## 2023-10-01 MED ORDER — OXYCODONE HCL 5 MG PO TABS
5.0000 mg | ORAL_TABLET | Freq: Four times a day (QID) | ORAL | 0 refills | Status: AC | PRN
  Filled 2023-10-01: qty 20, 5d supply, fill #0

## 2023-10-01 MED ORDER — IBUPROFEN 600 MG PO TABS
600.0000 mg | ORAL_TABLET | Freq: Four times a day (QID) | ORAL | 0 refills | Status: AC
  Filled 2023-10-01: qty 30, 8d supply, fill #0

## 2023-10-01 MED ORDER — ACETAMINOPHEN 325 MG PO TABS
650.0000 mg | ORAL_TABLET | ORAL | 0 refills | Status: AC | PRN
  Filled 2023-10-01: qty 30, 3d supply, fill #0

## 2023-10-01 NOTE — Lactation Note (Signed)
 This note was copied from a baby's chart. Lactation Consultation Note  Patient Name: Katelyn Peck [id number] Date: 10/01/2023 Age:32 hours Reason for consult: Follow-up assessment;Maternal discharge;1st time breastfeeding;Primapara;Term  P1, 39 wks, @ 48 hrs of life. Per mom- had given up on breast- "her milk had already dried up" Encouraged- teaching to mom- milk will come in day 3 or 4, the more stimulation we give to our breast- the faster/more our milk production comes in.  Discussed expectations @ breast- Day 2 more awake/ feeding cues/longer feeds, and cluster feeding overnights brings milk in.  Discussed hands on breast and baby, keeping baby awake @ breast. Starting with hand expression & breast compression to get baby working @ breast, and gentle stimulation to keep baby working @ breast. Encouraged EBM for nipple care post feed. LC services and milk storage shared. Highlighted risk of engorgement. Discussed hand pump/express to soften breasts, motrin as anti-inflammatory, and ice packs for 10-20 minutes post feed/pumping if still over-full is the best treatments for inflamed/engorged breasts. Hand pump provided, demonstrated use of home electric pump with mom.   Feeding Mother's Current Feeding Choice: Breast Milk and Formula   Interventions Interventions: Breast feeding basics reviewed;Hand express;Breast compression;Expressed milk;Coconut oil;DEBP;Hand pump;Education;LC Services brochure;CDC milk storage guidelines  Discharge Discharge Education: Engorgement and breast care Pump: DEBP;Manual;Personal  Consult Status Consult Status: Complete Date: 10/01/23 Follow-up type: In-patient    [facility] 10/01/2023, 12:58 PM

## 2023-10-01 NOTE — Discharge Summary (Signed)
 Postpartum Discharge Summary  Date of Service updated4/29/25     Patient Name: Katelyn Peck DOB: 05/06/1991 MRN: [id number]  Date of admission: 09/28/2023 Delivery date:09/29/2023 Delivering provider: [place] , JADE Date of discharge: 10/01/2023  Admitting diagnosis: Normal labor [O80, Z37.9] Post-dates pregnancy [O48.0] Intrauterine pregnancy: [redacted]w[redacted]d     Secondary diagnosis:  Principal Problem:   Normal labor Active Problems:   Post-dates pregnancy   Gestational hypertension   SVD (spontaneous vaginal delivery)  Additional problems: third degree laceration    Discharge diagnosis: Term Pregnancy Delivered and Gestational Hypertension                                              Post partum procedures:   Augmentation: Pitocin Complications: None  Hospital course: Onset of Labor With Vaginal Delivery      33 y.o. yo G1P1001 at [redacted]w[redacted]d was admitted in Latent Labor on 09/28/2023. Labor course was complicated byPROM   Membrane Rupture Time/Date: 11:00 AM,09/27/2023  Delivery Method:Vaginal, Spontaneous Operative Delivery:N/A Episiotomy: None Lacerations:  3rd degree Patient had a postpartum course complicated by third degree lac.  She is ambulating, tolerating a regular diet, passing flatus, and urinating well. Patient is discharged home in stable condition on 10/01/23.  Newborn Data: Birth date:09/29/2023 Birth time:12:31 PM Gender:Female Living status:Living Apgars:8 ,9  Weight:3420 g  Magnesium Sulfate received: No BMZ received: No Rhophylac:No MMR:No T-DaP:Given postpartum Flu: No RSV Vaccine received: No Transfusion:No Immunizations administered: There is no immunization history for the selected administration types on file for this patient.  Physical exam  Vitals:   09/30/23 0020 09/30/23 0409 09/30/23 1354 09/30/23 2046  BP: 120/77 103/73 119/76 126/84  Pulse: 73 70 63 75  Resp: 18 18 18 19   Temp: 98 F (36.7 C) 98.1 F (36.7 C) 98.2 F (36.8 C) 98 F (36.7  C)  TempSrc: Oral Oral Oral Axillary  SpO2: 98% 99% 99% 98%  Weight:      Height:       General: alert and cooperative Lochia: appropriate Uterine Fundus: firm Incision: N/A DVT Evaluation: Negative Homan's sign. Labs: Lab Results  Component Value Date   WBC 15.7 (H) 09/30/2023   HGB 11.3 (L) 09/30/2023   HCT 34.5 (L) 09/30/2023   MCV 76.3 (L) 09/30/2023   PLT 174 09/30/2023      Latest Ref Rng & Units 09/30/2023    4:54 AM  CMP  Glucose 70 - 99 mg/dL 76   BUN 6 - 20 mg/dL 6   Creatinine 4.09 - 8.11 mg/dL 9.14   Sodium 782 - 956 mmol/L 135   Potassium 3.5 - 5.1 mmol/L 3.4   Chloride 98 - 111 mmol/L 110   CO2 22 - 32 mmol/L 18   Calcium 8.9 - 10.3 mg/dL 8.7   Total Protein 6.5 - 8.1 g/dL 4.9   Total Bilirubin 0.0 - 1.2 mg/dL 0.7   Alkaline Phos 38 - 126 U/L 126   AST 15 - 41 U/L 45   ALT 0 - 44 U/L 20    Edinburgh Score:    10/01/2023    7:55 AM  Edinburgh Postnatal Depression Scale Screening Tool  I have been able to laugh and see the funny side of things. 0  I have looked forward with enjoyment to things. 0  I have blamed myself unnecessarily when things went wrong. 0  I  have been anxious or worried for no good reason. 0  I have felt scared or panicky for no good reason. 1  Things have been getting on top of me. 0  I have been so unhappy that I have had difficulty sleeping. 0  I have felt sad or miserable. 0  I have been so unhappy that I have been crying. 0  The thought of harming myself has occurred to me. 0  Edinburgh Postnatal Depression Scale Total 1      After visit meds:  Allergies as of 10/01/2023   No Known Allergies      Medication List     STOP taking these medications    metoCLOPramide  10 MG tablet Commonly known as: REGLAN        TAKE these medications    acetaminophen  325 MG tablet Commonly known as: Tylenol  Take 2 tablets (650 mg total) by mouth every 4 (four) hours as needed (for pain scale < 4).   benzocaine-Menthol  20-0.5 % Aero Commonly known as: DERMOPLAST Apply 1 Application topically as needed for irritation (perineal discomfort).   cholecalciferol 25 MCG (1000 UNIT) tablet Commonly known as: VITAMIN D3 Take 1,000 Units by mouth daily.   dibucaine 1 % Oint Commonly known as: NUPERCAINAL Place 1 Application rectally as needed for hemorrhoids.   ibuprofen 600 MG tablet Commonly known as: ADVIL Take 1 tablet (600 mg total) by mouth every 6 (six) hours.   oxyCODONE 5 MG immediate release tablet Commonly known as: Oxy IR/ROXICODONE Take 1 tablet (5 mg total) by mouth every 6 (six) hours as needed (pain scale 4-7).   prenatal multivitamin Tabs tablet Take 1 tablet by mouth daily at 12 noon.   senna-docusate 8.6-50 MG tablet Commonly known as: Senokot-S Take 2 tablets by mouth daily.   witch hazel-glycerin pad Commonly known as: TUCKS Apply 1 Application topically as needed for hemorrhoids.         Discharge home in stable condition Infant Feeding: Bottle and Breast Infant Disposition:home with mother Discharge instruction: per After Visit Summary and Postpartum booklet. Activity: Advance as tolerated. Pelvic rest for 6 weeks.  Diet: routine diet Anticipated Birth Control: Unsure Postpartum Appointment:6 weeks Additional Postpartum F/U:    Future Appointments:No future appointments. Follow up Visit:  Follow-up Information     [facility] Follow up.   Specialty: Obstetrics and Gynecology Why: already scheduled Contact information: [address] [place]   [address] [phone]                    10/01/2023 Norville Beery, MD

## 2023-10-10 ENCOUNTER — Inpatient Hospital Stay (HOSPITAL_COMMUNITY): Admission: RE | Admit: 2023-10-10 | Source: Home / Self Care | Admitting: Obstetrics and Gynecology

## 2023-10-10 ENCOUNTER — Inpatient Hospital Stay (HOSPITAL_COMMUNITY)

## 2023-10-12 ENCOUNTER — Telehealth (HOSPITAL_COMMUNITY): Payer: Self-pay

## 2023-10-12 NOTE — Telephone Encounter (Signed)
 10/12/2023 1458  Name: Katelyn Peck MRN: [id number] DOB: 12-Dec-1990  Reason for Call:  Transition of Care Hospital Discharge Call  Contact Status: Patient Contact Status: Message  Language assistant needed:          Follow-Up Questions:    [name] Postnatal Depression Scale:  In the Past 7 Days:    PHQ2-9 Depression Scale:     Discharge Follow-up:    Post-discharge interventions: NA  Signature  Wadell Guild
